# Patient Record
Sex: Male | Born: 1945 | Race: Black or African American | Hispanic: No | Smoking: Former smoker
Health system: Southern US, Community
[De-identification: ages and names within clinical notes are randomized; demographics above are authoritative.]

## PROBLEM LIST (undated history)

## (undated) ENCOUNTER — Emergency Department (HOSPITAL_COMMUNITY): Payer: Medicare PPO

## (undated) DIAGNOSIS — Z8619 Personal history of other infectious and parasitic diseases: Secondary | ICD-10-CM

## (undated) HISTORY — DX: Personal history of other infectious and parasitic diseases: Z86.19

---

## 2007-04-21 LAB — HM COLONOSCOPY

## 2008-01-29 ENCOUNTER — Emergency Department (HOSPITAL_COMMUNITY): Admission: EM | Admit: 2008-01-29 | Discharge: 2008-01-29 | Payer: Self-pay | Admitting: Emergency Medicine

## 2010-11-29 LAB — POCT CARDIAC MARKERS
Myoglobin, poc: 178 ng/mL (ref 12–200)
Troponin i, poc: <0.05 ng/mL (ref 0.00–0.09)

## 2010-11-29 LAB — COMPREHENSIVE METABOLIC PANEL
AST: 20 U/L (ref 0–37)
Albumin: 3.5 g/dL (ref 3.5–5.2)
Alkaline Phosphatase: 58 U/L (ref 39–117)
Chloride: 104 mEq/L (ref 96–112)
GFR calc Af Amer: >60 mL/min (ref >60)
Potassium: 4.1 mEq/L (ref 3.5–5.1)
Sodium: 137 mEq/L (ref 135–145)
Total Bilirubin: 1.8 mg/dL — ABNORMAL HIGH (ref 0.3–1.2)
Total Protein: 7.1 g/dL (ref 6.0–8.3)

## 2010-11-29 LAB — DIFFERENTIAL
Basophils Absolute: 0 10*3/uL (ref 0.0–0.1)
Basophils Relative: 0 % (ref 0–1)
Eosinophils Relative: 0 % (ref 0–5)
Lymphocytes Relative: 9 % — ABNORMAL LOW (ref 12–46)
Monocytes Absolute: 1.7 10*3/uL — ABNORMAL HIGH (ref 0.1–1.0)
Monocytes Relative: 10 % (ref 3–12)

## 2010-11-29 LAB — CBC
Platelets: 176 10*3/uL (ref 150–400)
WBC: 16.5 10*3/uL — ABNORMAL HIGH (ref 4.0–10.5)

## 2013-10-26 ENCOUNTER — Emergency Department (HOSPITAL_COMMUNITY)
Admission: EM | Admit: 2013-10-26 | Discharge: 2013-10-26 | Disposition: A | Discharge status: Home / Self Care | Payer: Medicare Other | Attending: Emergency Medicine | Admitting: Emergency Medicine

## 2013-10-26 DIAGNOSIS — T63001A Toxic effect of unspecified snake venom, accidental (unintentional), initial encounter: Secondary | ICD-10-CM | POA: Diagnosis not present

## 2013-10-26 DIAGNOSIS — S61209A Unspecified open wound of unspecified finger without damage to nail, initial encounter: Secondary | ICD-10-CM | POA: Diagnosis not present

## 2013-10-26 DIAGNOSIS — T6391XA Toxic effect of contact with unspecified venomous animal, accidental (unintentional), initial encounter: Secondary | ICD-10-CM | POA: Diagnosis not present

## 2013-10-26 DIAGNOSIS — Z79899 Other long term (current) drug therapy: Secondary | ICD-10-CM | POA: Insufficient documentation

## 2013-10-26 DIAGNOSIS — Y93H2 Activity, gardening and landscaping: Secondary | ICD-10-CM | POA: Insufficient documentation

## 2013-10-26 DIAGNOSIS — T148XXA Other injury of unspecified body region, initial encounter: Secondary | ICD-10-CM | POA: Diagnosis not present

## 2013-10-26 DIAGNOSIS — T63121A Toxic effect of venom of other venomous lizard, accidental (unintentional), initial encounter: Secondary | ICD-10-CM | POA: Insufficient documentation

## 2013-10-26 DIAGNOSIS — T1490XA Injury, unspecified, initial encounter: Secondary | ICD-10-CM | POA: Diagnosis not present

## 2013-10-26 DIAGNOSIS — Y92009 Unspecified place in unspecified non-institutional (private) residence as the place of occurrence of the external cause: Secondary | ICD-10-CM | POA: Insufficient documentation

## 2013-10-26 LAB — CBC
HEMATOCRIT: 39.6 % (ref 39.0–52.0)
HEMOGLOBIN: 13.9 g/dL (ref 13.0–17.0)
MCH: 33.3 pg (ref 26.0–34.0)
MCHC: 35.1 g/dL (ref 30.0–36.0)
MCV: 95 fL (ref 78.0–100.0)
Platelets: 207 10*3/uL (ref 150–400)
RBC: 4.17 MIL/uL — ABNORMAL LOW (ref 4.22–5.81)
RDW: 11.7 % (ref 11.5–15.5)
WBC: 7.2 10*3/uL (ref 4.0–10.5)

## 2013-10-26 LAB — BASIC METABOLIC PANEL
Anion gap: 15 (ref 5–15)
BUN: 15 mg/dL (ref 6–23)
CO2: 22 meq/L (ref 19–32)
Calcium: 9.8 mg/dL (ref 8.4–10.5)
Chloride: 102 mEq/L (ref 96–112)
Creatinine, Ser: 1.37 mg/dL — ABNORMAL HIGH (ref 0.50–1.35)
GFR calc Af Amer: 59 mL/min — ABNORMAL LOW (ref >90)
GFR calc non Af Amer: 51 mL/min — ABNORMAL LOW (ref >90)
GLUCOSE: 115 mg/dL — AB (ref 70–99)
POTASSIUM: 4.4 meq/L (ref 3.7–5.3)
SODIUM: 139 meq/L (ref 137–147)

## 2013-10-26 LAB — PROTIME-INR
INR: 0.96 (ref 0.00–1.49)
PROTHROMBIN TIME: 12.8 s (ref 11.6–15.2)

## 2013-10-26 LAB — APTT: aPTT: 26 seconds (ref 24–37)

## 2013-10-26 LAB — FIBRINOGEN: Fibrinogen: 340 mg/dL (ref 204–475)

## 2013-10-26 MED ORDER — HYDROMORPHONE HCL PF 1 MG/ML IJ SOLN
1.0000 mg | Freq: Once | INTRAMUSCULAR | Status: AC
  Administered 2013-10-26: 1 mg via INTRAVENOUS
  Filled 2013-10-26: qty 1

## 2013-10-26 NOTE — ED Notes (Signed)
Bed: NF62 Expected date:  Expected time:  Means of arrival:  Comments: [place]

## 2013-10-26 NOTE — ED Notes (Signed)
Spoke to [employer] Judeth Cornfield) who advised the following: Lab work CBC, PT, INR, Fibrinogen, and BMET 4-6 hours from now.  Elevation of extremity ideally 18 inches above heart. Evaluating extremity edema , pulse and capillary refill. Judeth Cornfield will fax the management protocol needed

## 2013-10-26 NOTE — ED Provider Notes (Signed)
CSN: [id number]     Arrival date & time 10/26/13  1042 History   First MD Initiated Contact with Patient 10/26/13 1043     Chief Complaint  Patient presents with  . Snake Bite     HPI Patient reports copper head bite to his right ring finger which occurred just prior to arrival.  He has pain and swelling into his right hand.  He was cleaning around the bushes in his front yard.pain is mild in severity only at this time.  No other complaints.  Denies nausea vomiting.  No shortness of breath.  No headache.  No diaphoresis   No past medical history on file. No past surgical history on file. No family history on file. History  Substance Use Topics  . Smoking status: Not on file  . Smokeless tobacco: Not on file  . Alcohol Use: Not on file    Review of Systems  All other systems reviewed and are negative.     Allergies  Review of patient's allergies indicates no known allergies.  Home Medications   Prior to Admission medications   Medication Sig Start Date End Date Taking? Authorizing Provider  aspirin EC 81 MG tablet Take 81 mg by mouth once as needed.   Yes Historical Provider, MD  Multiple Vitamin (MULTIVITAMIN WITH MINERALS) TABS tablet Take 1 tablet by mouth daily.   Yes Historical Provider, MD   BP 131/85  Resp 21 Physical Exam  Nursing note and vitals reviewed. Constitutional: He is oriented to person, place, and time. He appears well-developed and well-nourished.  HENT:  Head: Normocephalic.  Eyes: EOM are normal.  Neck: Normal range of motion.  Pulmonary/Chest: Effort normal.  Abdominal: He exhibits no distension.  Musculoskeletal: Normal range of motion.  Focus swelling of his right hand.  This does not cross the margin of the right wrist.  Small single envenomation site noted on the middle phalanx of the right ring finger.  Neurological: He is alert and oriented to person, place, and time.  Psychiatric: He has a normal mood and affect.    ED Course   Procedures (including critical care time) Labs Review Labs Reviewed  CBC - Abnormal; Notable for the following:    RBC 4.17 (*)    All other components within normal limits  BASIC METABOLIC PANEL - Abnormal; Notable for the following:    Glucose, Bld 115 (*)    Creatinine, Ser 1.37 (*)    GFR calc non Af Amer 51 (*)    GFR calc Af Amer 59 (*)    All other components within normal limits  PROTIME-INR  APTT  FIBRINOGEN    Imaging Review No results found.   EKG Interpretation None      MDM   Final diagnoses:  Snake bite, accidental or unintentional, initial encounter    Labs ordered.  Patient be observed in the emergency department for several hours.  We'll continue to treat symptoms and monitor for swelling.  Currently following the [facility] snakebite protocol. Case was discussed with [facility] as well  3:59 PM Patient was observed in the emergency department for approximately 4 and half hours.  He has had resolution of his swelling.  It has not progressed.  Discharge home in good condition    Lyanne Co, MD 10/26/13 1600

## 2013-10-26 NOTE — ED Notes (Signed)
He remains comfortable and cheerful as his right arm/hand remain elevated in an ingenious soft sling applied by our ortho. Tech.  The erythema of right hand and wrist are somewhat diminished as compared to when he first arrived.  Edema of medial posterior right hand is essentially the same as when he arrived.  He denies h/a, nor any breathing difficulties/issues and is in no distress.  He is pleasantly visiting with his wife.

## 2013-11-02 DIAGNOSIS — T6391XA Toxic effect of contact with unspecified venomous animal, accidental (unintentional), initial encounter: Secondary | ICD-10-CM | POA: Diagnosis not present

## 2013-11-02 DIAGNOSIS — I959 Hypotension, unspecified: Secondary | ICD-10-CM | POA: Diagnosis not present

## 2013-11-02 DIAGNOSIS — M6740 Ganglion, unspecified site: Secondary | ICD-10-CM | POA: Diagnosis not present

## 2013-11-02 DIAGNOSIS — Z1331 Encounter for screening for depression: Secondary | ICD-10-CM | POA: Diagnosis not present

## 2014-06-19 ENCOUNTER — Telehealth: Payer: Self-pay | Admitting: Family Medicine

## 2014-06-19 NOTE — Telephone Encounter (Signed)
OKay to schedule earlier in next week.

## 2014-06-19 NOTE — Telephone Encounter (Signed)
Raiford SimmondsHarrett Eldridge [birth year] called wanting to make a new patient appointment with you for Conal Cavanagh.  Your next new patient appointment is in nov  Ms eldridge stated you told her he could be seen sooner.  He has a boil on his back.  Is it ok to schedule??

## 2014-06-21 NOTE — Telephone Encounter (Signed)
Appointment 5/5 pt aware °

## 2014-06-29 ENCOUNTER — Encounter (INDEPENDENT_AMBULATORY_CARE_PROVIDER_SITE_OTHER): Payer: Self-pay

## 2014-06-29 ENCOUNTER — Ambulatory Visit (INDEPENDENT_AMBULATORY_CARE_PROVIDER_SITE_OTHER): Payer: Medicare Other | Admitting: Family Medicine

## 2014-06-29 ENCOUNTER — Encounter: Payer: Self-pay | Admitting: Family Medicine

## 2014-06-29 VITALS — BP 110/60 | HR 84 | Temp 97.6°F | Ht 71.0 in | Wt 234.5 lb

## 2014-06-29 DIAGNOSIS — L02212 Cutaneous abscess of back [any part, except buttock]: Secondary | ICD-10-CM | POA: Diagnosis not present

## 2014-06-29 MED ORDER — CEPHALEXIN 500 MG PO CAPS: 500.0000 mg | ORAL_CAPSULE | Freq: Three times a day (TID) | ORAL | Status: DC

## 2014-06-29 MED ORDER — SILDENAFIL CITRATE 50 MG PO TABS: 50.0000 mg | ORAL_TABLET | Freq: Every day | ORAL | Status: DC | PRN

## 2014-06-29 NOTE — Progress Notes (Signed)
Pre visit review using our clinic review tool, if applicable. No additional management support is needed unless otherwise documented below in the visit note. 

## 2014-06-29 NOTE — Progress Notes (Signed)
Subjective:    Patient ID: Christopher [name], male    DOB: 1945-03-08, 69 y.o.   MRN: [id number]  HPI 69 year old male presents to establish. He reports no other health problems. Followed by Dr. Azucena Kubaeid at [facility].  Last CPX: summer 2015  Has not had labs in over a year. .  Has cyst on back,  Reappeared in last month.  Has been applying salve.  Had removed last year after infection by Dr. Azucena Kubaeid.  Lesion resolves then comes back. Notes pus draining out. No fever.  Some surrounding erythema and pain.     Review of Systems  Constitutional: Negative for fever, fatigue and unexpected weight change.  HENT: Negative for congestion, ear pain, postnasal drip, rhinorrhea, sore throat and trouble swallowing.   Eyes: Negative for pain.  Respiratory: Negative for cough, shortness of breath and wheezing.   Cardiovascular: Negative for chest pain, palpitations and leg swelling.  Gastrointestinal: Negative for nausea, abdominal pain, diarrhea, constipation and blood in stool.  Genitourinary: Negative for dysuria, urgency, hematuria, discharge, penile swelling, scrotal swelling, difficulty urinating, penile pain and testicular pain.  Skin: Negative for rash.  Neurological: Negative for syncope, weakness, light-headedness, numbness and headaches.  Psychiatric/Behavioral: Negative for behavioral problems and dysphoric mood. The patient is not nervous/anxious.        Objective:   Physical Exam  Constitutional: He appears well-developed and well-nourished.  Non-toxic appearance. He does not appear ill. No distress.  HENT:  Head: Normocephalic and atraumatic.  Right Ear: Hearing, tympanic membrane, external ear and ear canal normal.  Left Ear: Hearing, tympanic membrane, external ear and ear canal normal.  Nose: Nose normal.  Mouth/Throat: Uvula is midline, oropharynx is clear and moist and mucous membranes are normal.  Eyes: Conjunctivae, EOM and lids are normal. Pupils are equal, round, and  reactive to light. Lids are everted and swept, no foreign bodies found.  Neck: Trachea normal, normal range of motion and phonation normal. Neck supple. Carotid bruit is not present. No thyroid mass and no thyromegaly present.  Cardiovascular: Normal rate, regular rhythm, S1 normal, S2 normal, intact distal pulses and normal pulses.  Exam reveals no gallop.   No murmur heard. Pulmonary/Chest: Breath sounds normal. He has no wheezes. He has no rhonchi. He has no rales.  Abdominal: Soft. Normal appearance and bowel sounds are normal. There is no hepatosplenomegaly. There is no tenderness. There is no rebound, no guarding and no CVA tenderness. No hernia. Hernia confirmed negative in the right inguinal area and confirmed negative in the left inguinal area.  Genitourinary: Prostate normal, testes normal and penis normal. Rectal exam shows no external hemorrhoid, no internal hemorrhoid, no fissure, no mass, no tenderness and anal tone normal. Guaiac negative stool. Prostate is not enlarged and not tender. Right testis shows no mass and no tenderness. Left testis shows no mass and no tenderness. No paraphimosis or penile tenderness.  Lymphadenopathy:    He has no cervical adenopathy.       Right: No inguinal adenopathy present.       Left: No inguinal adenopathy present.  Neurological: He is alert. He has normal strength and normal reflexes. No cranial nerve deficit or sensory deficit. Gait normal.  Skin: Skin is warm, dry and intact. Lesion noted. No rash noted.  Raised  1.5 cm lesion, slight redness, no surrounding erythema, central pore present Mildly tender  Psychiatric: He has a normal mood and affect. His speech is normal and behavior is normal. Judgment normal.  Assessment & Plan:

## 2014-06-29 NOTE — Patient Instructions (Addendum)
Schedule CPX with labs prior  in next 3 months.  warm compresses, compelte antibitoics.  Cal if drainage and redness not improving, call sooner if fever or redness spreading.

## 2014-07-27 DIAGNOSIS — L02212 Cutaneous abscess of back [any part, except buttock]: Secondary | ICD-10-CM | POA: Insufficient documentation

## 2014-07-27 NOTE — Assessment & Plan Note (Signed)
Given drainage. Treat with warm compresses, complete antibiotics . Follow up for I and D if not resolving as expected.

## 2014-09-28 ENCOUNTER — Telehealth: Payer: Self-pay | Admitting: Family Medicine

## 2014-09-28 ENCOUNTER — Other Ambulatory Visit (INDEPENDENT_AMBULATORY_CARE_PROVIDER_SITE_OTHER): Payer: Medicare Other

## 2014-09-28 DIAGNOSIS — Z13220 Encounter for screening for lipoid disorders: Secondary | ICD-10-CM

## 2014-09-28 LAB — COMPREHENSIVE METABOLIC PANEL
ALBUMIN: 4.2 g/dL (ref 3.5–5.2)
ALT: 19 U/L (ref 0–53)
AST: 20 U/L (ref 0–37)
Alkaline Phosphatase: 59 U/L (ref 39–117)
BUN: 14 mg/dL (ref 6–23)
CHLORIDE: 105 meq/L (ref 96–112)
CO2: 29 mEq/L (ref 19–32)
Calcium: 9.3 mg/dL (ref 8.4–10.5)
Creatinine, Ser: 1.45 mg/dL (ref 0.40–1.50)
GFR: 61.89 mL/min (ref >60.00)
GLUCOSE: 101 mg/dL — AB (ref 70–99)
Potassium: 4.4 mEq/L (ref 3.5–5.1)
Sodium: 139 mEq/L (ref 135–145)
Total Bilirubin: 0.9 mg/dL (ref 0.2–1.2)
Total Protein: 7.3 g/dL (ref 6.0–8.3)

## 2014-09-28 LAB — LIPID PANEL
CHOL/HDL RATIO: 6
Cholesterol: 216 mg/dL — ABNORMAL HIGH (ref 0–200)
HDL: 37.8 mg/dL — AB (ref >39.00)
LDL Cholesterol: 152 mg/dL — ABNORMAL HIGH (ref 0–99)
NonHDL: 177.7
Triglycerides: 131 mg/dL (ref 0.0–149.0)
VLDL: 26.2 mg/dL (ref 0.0–40.0)

## 2014-09-28 NOTE — Telephone Encounter (Signed)
-----   Message from Alvina Chou sent at 09/21/2014  3:36 PM EDT ----- Regarding: Lab orders for Thursday, 8.4.16 Patient is scheduled for CPX labs, please order future labs, Thanks , Camelia Eng

## 2014-10-05 ENCOUNTER — Ambulatory Visit (INDEPENDENT_AMBULATORY_CARE_PROVIDER_SITE_OTHER): Payer: Medicare Other | Admitting: Family Medicine

## 2014-10-05 ENCOUNTER — Encounter: Payer: Self-pay | Admitting: Family Medicine

## 2014-10-05 VITALS — BP 110/70 | HR 66 | Temp 97.7°F | Ht 71.0 in | Wt 236.0 lb

## 2014-10-05 DIAGNOSIS — Z7189 Other specified counseling: Secondary | ICD-10-CM

## 2014-10-05 DIAGNOSIS — Z23 Encounter for immunization: Secondary | ICD-10-CM

## 2014-10-05 DIAGNOSIS — E786 Lipoprotein deficiency: Secondary | ICD-10-CM

## 2014-10-05 DIAGNOSIS — Z0000 Encounter for general adult medical examination without abnormal findings: Secondary | ICD-10-CM

## 2014-10-05 DIAGNOSIS — N182 Chronic kidney disease, stage 2 (mild): Secondary | ICD-10-CM | POA: Diagnosis not present

## 2014-10-05 DIAGNOSIS — E7800 Pure hypercholesterolemia, unspecified: Secondary | ICD-10-CM

## 2014-10-05 NOTE — Addendum Note (Signed)
Addended by: Desmond Dike on: 10/05/2014 12:31 PM   Modules accepted: Orders

## 2014-10-05 NOTE — Progress Notes (Signed)
Pre visit review using our clinic review tool, if applicable. No additional management support is needed unless otherwise documented below in the visit note. 

## 2014-10-05 NOTE — Assessment & Plan Note (Signed)
Push fluids, avoid NSAIDs etc. BP well controlled, no DM.

## 2014-10-05 NOTE — Assessment & Plan Note (Signed)
INfo given on diet and exercise.  Reviewed in detail. Recheck in 1 year.

## 2014-10-05 NOTE — Patient Instructions (Addendum)
Work on low cholesterol diet. Decrease cream, butter and baked goods. Increase exercise to 3-5 times a week.  Drink lots of water and avoid ibuprofen, aleve to keep kidney's healthy.

## 2014-10-05 NOTE — Progress Notes (Signed)
Subjective:    Patient ID: Christopher [name], male    DOB: Jan 13, 1946, 69 y.o.   MRN: [id number]  HPI I have personally reviewed the Medicare Annual Wellness questionnaire and have noted 1. The patient's medical and social history 2. Their use of alcohol, tobacco or illicit drugs 3. Their current medications and supplements 4. The patient's functional ability including ADL's, fall risks, home safety risks and hearing or visual             impairment. 5. Diet and physical activities 6. Evidence for depression or mood disorders 7.         Updated provider list The patients weight, height, BMI and visual acuity have been recorded in the chart I have made referrals, counseling and provided education to the patient based review of the above and I have provided the pt with a written personalized care plan for preventive services.    BP Readings from Last 3 Encounters:  10/05/14 110/70  06/29/14 110/60  10/26/13 112/79    High cholesterol, new diagnosis. Goal LDl < 130. On no med.  Lab Results  Component Value Date   CHOL 216* 09/28/2014   HDL 37.80* 09/28/2014   LDLCALC 152* 09/28/2014   TRIG 131.0 09/28/2014   CHOLHDL 6 09/28/2014  Using medications without problems: Muscle aches:  Diet compliance: Moderate Exercise: twice daily walking Other complaints:       Review of Systems  Constitutional: Negative for fever and fatigue.  HENT: Negative for ear pain.   Eyes: Negative for pain.  Respiratory: Negative for cough and shortness of breath.   Cardiovascular: Negative for chest pain, palpitations and leg swelling.  Gastrointestinal: Negative for abdominal pain.  Genitourinary: Negative for dysuria.  Musculoskeletal: Negative for arthralgias.  Neurological: Negative for syncope, light-headedness and headaches.  Psychiatric/Behavioral: Negative for dysphoric mood.       Objective:   Physical Exam  Constitutional: He appears well-developed and well-nourished.  Non-toxic  appearance. He does not appear ill. No distress.  HENT:  Head: Normocephalic and atraumatic.  Right Ear: Hearing, tympanic membrane, external ear and ear canal normal.  Left Ear: Hearing, tympanic membrane, external ear and ear canal normal.  Nose: Nose normal.  Mouth/Throat: Uvula is midline, oropharynx is clear and moist and mucous membranes are normal.  Eyes: Conjunctivae, EOM and lids are normal. Pupils are equal, round, and reactive to light. Lids are everted and swept, no foreign bodies found.  Neck: Trachea normal, normal range of motion and phonation normal. Neck supple. Carotid bruit is not present. No thyroid mass and no thyromegaly present.  Cardiovascular: Normal rate, regular rhythm, S1 normal, S2 normal, intact distal pulses and normal pulses.  Exam reveals no gallop.   No murmur heard. Pulmonary/Chest: Breath sounds normal. He has no wheezes. He has no rhonchi. He has no rales.  Abdominal: Soft. Normal appearance and bowel sounds are normal. There is no hepatosplenomegaly. There is no tenderness. There is no rebound, no guarding and no CVA tenderness. No hernia.  Genitourinary: Prostate normal.  Lymphadenopathy:    He has no cervical adenopathy.  Neurological: He is alert. He has normal strength and normal reflexes. No cranial nerve deficit or sensory deficit. Gait normal.  Skin: Skin is warm, dry and intact. No rash noted.  Psychiatric: He has a normal mood and affect. His speech is normal and behavior is normal. Judgment normal.          Assessment & Plan:  The patient's preventative maintenance and recommended screening  tests for an annual wellness exam were reviewed in full today. Brought up to date unless services declined.  Counselled on the importance of diet, exercise, and its role in overall health and mortality. The patient's FH and SH was reviewed, including their home life, tobacco status, and drug and alcohol status.   Vaccines: uptodate tdap. Given prevnar  today.  prostate: No results found for: PSA no prostate cancer in family. Plan stopping PSA, prostate exam given age. Hep C: will do at next visit. Colon: 2008 polyps, repeat in 10 years. Dr. Bosie Clos Former smoker:  remotely

## 2015-08-02 LAB — FECAL OCCULT BLOOD, GUAIAC: Fecal Occult Blood: NEGATIVE

## 2015-08-21 ENCOUNTER — Encounter: Payer: Self-pay | Admitting: Family Medicine

## 2015-10-09 DIAGNOSIS — H2513 Age-related nuclear cataract, bilateral: Secondary | ICD-10-CM | POA: Diagnosis not present

## 2015-10-09 DIAGNOSIS — H524 Presbyopia: Secondary | ICD-10-CM | POA: Diagnosis not present

## 2015-11-08 ENCOUNTER — Telehealth: Payer: Self-pay | Admitting: Family Medicine

## 2015-11-08 ENCOUNTER — Other Ambulatory Visit (INDEPENDENT_AMBULATORY_CARE_PROVIDER_SITE_OTHER): Payer: Medicare PPO

## 2015-11-08 ENCOUNTER — Ambulatory Visit (INDEPENDENT_AMBULATORY_CARE_PROVIDER_SITE_OTHER): Payer: Medicare PPO

## 2015-11-08 VITALS — BP 112/80 | HR 75 | Temp 97.4°F | Ht 71.25 in | Wt 237.2 lb

## 2015-11-08 DIAGNOSIS — E7800 Pure hypercholesterolemia, unspecified: Secondary | ICD-10-CM

## 2015-11-08 DIAGNOSIS — Z23 Encounter for immunization: Secondary | ICD-10-CM | POA: Diagnosis not present

## 2015-11-08 DIAGNOSIS — Z0000 Encounter for general adult medical examination without abnormal findings: Secondary | ICD-10-CM

## 2015-11-08 DIAGNOSIS — Z1159 Encounter for screening for other viral diseases: Secondary | ICD-10-CM

## 2015-11-08 LAB — COMPREHENSIVE METABOLIC PANEL
ALT: 17 U/L (ref 0–53)
AST: 17 U/L (ref 0–37)
Albumin: 4.3 g/dL (ref 3.5–5.2)
Alkaline Phosphatase: 58 U/L (ref 39–117)
BILIRUBIN TOTAL: 0.7 mg/dL (ref 0.2–1.2)
BUN: 15 mg/dL (ref 6–23)
CO2: 31 meq/L (ref 19–32)
CREATININE: 1.29 mg/dL (ref 0.40–1.50)
Calcium: 9.2 mg/dL (ref 8.4–10.5)
Chloride: 105 mEq/L (ref 96–112)
GFR: 70.6 mL/min (ref >60.00)
GLUCOSE: 99 mg/dL (ref 70–99)
Potassium: 4.3 mEq/L (ref 3.5–5.1)
Sodium: 139 mEq/L (ref 135–145)
Total Protein: 7.3 g/dL (ref 6.0–8.3)

## 2015-11-08 LAB — LIPID PANEL
CHOL/HDL RATIO: 6
Cholesterol: 200 mg/dL (ref 0–200)
HDL: 35.3 mg/dL — AB (ref >39.00)
LDL Cholesterol: 141 mg/dL — ABNORMAL HIGH (ref 0–99)
NONHDL: 164.69
Triglycerides: 119 mg/dL (ref 0.0–149.0)
VLDL: 23.8 mg/dL (ref 0.0–40.0)

## 2015-11-08 NOTE — Telephone Encounter (Signed)
-----   Message from Natasha C Chavers sent at 11/02/2015  1:47 PM EDT ----- Regarding: Cpx labs Thurs 9/14, need orders. Thanks :-) Please order  future cpx labs for pt's upcoming lab appt. Thanks Tasha  

## 2015-11-08 NOTE — Progress Notes (Signed)
PCP notes:   Health maintenance:  Flu vaccine - pt will discuss with PCP at CPE Colon cancer screening - pt does FOBT through insurance Hep C screening - completed PPSV23 - administered   Abnormal screenings:   Hearing - failed  Patient concerns:   None  Nurse concerns:  None  Next PCP appt:   11/15/15 @ 1115

## 2015-11-08 NOTE — Progress Notes (Signed)
Subjective:   Christopher [name] is a 70 y.o. male who presents for Medicare Annual/Subsequent preventive examination.  Review of Systems:  N/A Cardiac Risk Factors include: advanced age (>46men, >54 women);male gender;dyslipidemia     Objective:    Vitals: BP 112/80 (BP Location: Left Arm, Patient Position: Sitting, Cuff Size: Normal)   Pulse 75   Temp 97.4 F (36.3 C) (Oral)   Ht 5' 11.25" (1.81 m) Comment: no shoes  Wt 237 lb 4 oz (107.6 kg)   SpO2 94%   BMI 32.86 kg/m   Body mass index is 32.86 kg/m.  Tobacco History  Smoking Status  . Former Smoker  . Packs/day: 1.00  . Years: 16.00  . Types: Cigarettes  . Quit date: 06/29/1982  Smokeless Tobacco  . Never Used     Counseling given: No   Past Medical History:  Diagnosis Date  . History of chicken pox    History reviewed. No pertinent surgical history. Family History  Problem Relation Age of Onset  . Diabetes Mother   . Heart disease Father    History  Sexual Activity  . Sexual activity: No    Outpatient Encounter Prescriptions as of 11/08/2015  Medication Sig  . aspirin EC 81 MG tablet Take 81 mg by mouth once as needed.  . Doxylamine Succinate, Sleep, (SLEEP AID PO) Take 1 tablet by mouth at bedtime as needed.  . Multiple Vitamin (MULTIVITAMIN WITH MINERALS) TABS tablet Take 1 tablet by mouth daily.   No facility-administered encounter medications on file as of 11/08/2015.     Activities of Daily Living In your present state of health, do you have any difficulty performing the following activities: 11/08/2015  Hearing? N  Vision? N  Difficulty concentrating or making decisions? N  Walking or climbing stairs? N  Dressing or bathing? N  Doing errands, shopping? N  Preparing Food and eating ? N  Using the Toilet? N  In the past six months, have you accidently leaked urine? N  Do you have problems with loss of bowel control? N  Managing your Medications? N  Managing your Finances? N  Housekeeping  or managing your Housekeeping? N  Some recent data might be hidden    Patient Care Team: Excell Seltzer, MD as PCP - General (Family Medicine)   Assessment:     Hearing Screening   125Hz  250Hz  500Hz  1000Hz  2000Hz  3000Hz  4000Hz  6000Hz  8000Hz   Right ear:   0 0 40  40    Left ear:   40 40 40  40    Vision Screening Comments: Last eye exam with Dr. Druscilla Brownie B on 10/07/15   Exercise Activities and Dietary recommendations Current Exercise Habits: Home exercise routine, Type of exercise: walking, Time (Minutes): 60, Frequency (Times/Week): 4, Weekly Exercise (Minutes/Week): 240, Intensity: Mild, Exercise limited by: None identified  Goals    . Increase physical activity          Starting 11/08/2015, I will continue to walk at least 60 min 4 days per week.       Fall Risk Fall Risk  11/08/2015  Falls in the past year? No   Depression Screen PHQ 2/9 Scores 11/08/2015  PHQ - 2 Score 0    Cognitive Testing MMSE - Mini Mental State Exam 11/08/2015  Orientation to time 5  Orientation to Place 5  Registration 3  Attention/ Calculation 0  Recall 3  Language- name 2 objects 0  Language- repeat 1  Language- follow 3 step command  3  Language- read & follow direction 0  Write a sentence 0  Copy design 0  Total score 20   PLEASE NOTE: A Mini-Cog screen was completed. Maximum score is 20. A value of 0 denotes this part of Folstein MMSE was not completed or the patient failed this part of the Mini-Cog screening.   Mini-Cog Screening Orientation to Time - Max 5 pts Orientation to Place - Max 5 pts Registration - Max 3 pts Recall - Max 3 pts Language Repeat - Max 1 pts Language Follow 3 Step Command - Max 3 pts   Immunization History  Administered Date(s) Administered  . Pneumococcal Conjugate-13 10/05/2014  . Pneumococcal Polysaccharide-23 11/08/2015  . Tdap 07/29/2013  . Varicella 02/25/2012   Screening Tests Health Maintenance  Topic Date Due  . INFLUENZA VACCINE  11/15/2015  (Originally 09/25/2015)  . COLONOSCOPY  11/07/2025 (Originally 10/22/1994)  . COLON CANCER SCREENING ANNUAL FOBT  08/01/2016  . TETANUS/TDAP  07/30/2023  . ZOSTAVAX  Addressed  . Hepatitis C Screening  Completed  . PNA vac Low Risk Adult  Completed      Plan:     I have personally reviewed and addressed the Medicare Annual Wellness questionnaire and have noted the following in the patient's chart:  A. Medical and social history B. Use of alcohol, tobacco or illicit drugs  C. Current medications and supplements D. Functional ability and status E.  Nutritional status F.  Physical activity G. Advance directives H. List of other physicians I.  Hospitalizations, surgeries, and ER visits in previous 12 months J.  Vitals K. Screenings to include hearing, vision, cognitive, depression L. Referrals and appointments - none  In addition, I have reviewed and discussed with patient certain preventive protocols, quality metrics, and best practice recommendations. A written personalized care plan for preventive services as well as general preventive health recommendations were provided to patient.  See attached scanned questionnaire for additional information.   Signed,   Randa EvensLesia Rhyder Koegel, MHA, BS, LPN Health Advisor

## 2015-11-08 NOTE — Progress Notes (Signed)
I reviewed health advisor's note, was available for consultation, and agree with documentation and plan.   Signed,  Icy Fuhrmann T. Marika Mahaffy, MD  

## 2015-11-08 NOTE — Progress Notes (Signed)
Pre visit review using our clinic review tool, if applicable. No additional management support is needed unless otherwise documented below in the visit note. 

## 2015-11-08 NOTE — Patient Instructions (Signed)
[name] , Thank you for taking time to come for your Medicare Wellness Visit. I appreciate your ongoing commitment to your health goals. Please review the following plan we discussed and let me know if I can assist you in the future.   These are the goals we discussed: Goals    . Increase physical activity          Starting 11/08/2015, I will continue to walk at least 60 min 4 days per week.        This is a list of the screening recommended for you and due dates:  Health Maintenance  Topic Date Due  . Flu Shot  11/15/2015*  . Colon Cancer Screening  11/07/2025*  . Stool Blood Test  08/01/2016  . Tetanus Vaccine  07/30/2023  . Shingles Vaccine  Addressed  .  Hepatitis C: One time screening is recommended by Center for Disease Control  (CDC) for  adults born from [birth year] through 1965.   Completed  . Pneumonia vaccines  Completed  *Topic was postponed. The date shown is not the original due date.   Preventive Care for Adults  A healthy lifestyle and preventive care can promote health and wellness. Preventive health guidelines for adults include the following key practices.  . A routine yearly physical is a good way to check with your health care provider about your health and preventive screening. It is a chance to share any concerns and updates on your health and to receive a thorough exam.  . Visit your dentist for a routine exam and preventive care every 6 months. Brush your teeth twice a day and floss once a day. Good oral hygiene prevents tooth decay and gum disease.  . The frequency of eye exams is based on your age, health, family medical history, use  of contact lenses, and other factors. Follow your health care provider's ecommendations for frequency of eye exams.  . Eat a healthy diet. Foods like vegetables, fruits, whole grains, low-fat dairy products, and lean protein foods contain the nutrients you need without too many calories. Decrease your intake of foods high in  solid fats, added sugars, and salt. Eat the right amount of calories for you. Get information about a proper diet from your health care provider, if necessary.  . Regular physical exercise is one of the most important things you can do for your health. Most adults should get at least 150 minutes of moderate-intensity exercise (any activity that increases your heart rate and causes you to sweat) each week. In addition, most adults need muscle-strengthening exercises on 2 or more days a week.  Silver Sneakers may be a benefit available to you. To determine eligibility, you may visit the website: www.silversneakers.com or contact program at [phone] Mon-Fri between 8AM-8PM.   . Maintain a healthy weight. The body mass index (BMI) is a screening tool to identify possible weight problems. It provides an estimate of body fat based on height and weight. Your health care provider can find your BMI and can help you achieve or maintain a healthy weight.   For adults 20 years and older: ? A BMI below 18.5 is considered underweight. ? A BMI of 18.5 to 24.9 is normal. ? A BMI of 25 to 29.9 is considered overweight. ? A BMI of 30 and above is considered obese.   . Maintain normal blood lipids and cholesterol levels by exercising and minimizing your intake of saturated fat. Eat a balanced diet with plenty of fruit and  vegetables. Blood tests for lipids and cholesterol should begin at age 34 and be repeated every 5 years. If your lipid or cholesterol levels are high, you are over 50, or you are at high risk for heart disease, you may need your cholesterol levels checked more frequently. Ongoing high lipid and cholesterol levels should be treated with medicines if diet and exercise are not working.  . If you smoke, find out from your health care provider how to quit. If you do not use tobacco, please do not start.  . If you choose to drink alcohol, please do not consume more than 2 drinks per day. One drink  is considered to be 12 ounces (355 mL) of beer, 5 ounces (148 mL) of wine, or 1.5 ounces (44 mL) of liquor.  . If you are 55-46 years old, ask your health care provider if you should take aspirin to prevent strokes.  . Use sunscreen. Apply sunscreen liberally and repeatedly throughout the day. You should seek shade when your shadow is shorter than you. Protect yourself by wearing long sleeves, pants, a wide-brimmed hat, and sunglasses year round, whenever you are outdoors.  . Once a month, do a whole body skin exam, using a mirror to look at the skin on your back. Tell your health care provider of new moles, moles that have irregular borders, moles that are larger than a pencil eraser, or moles that have changed in shape or color.

## 2015-11-09 LAB — HEPATITIS C ANTIBODY: HCV Ab: NEGATIVE

## 2015-11-15 ENCOUNTER — Ambulatory Visit (INDEPENDENT_AMBULATORY_CARE_PROVIDER_SITE_OTHER): Payer: Medicare PPO | Admitting: Family Medicine

## 2015-11-15 ENCOUNTER — Encounter: Payer: Self-pay | Admitting: Family Medicine

## 2015-11-15 DIAGNOSIS — E7800 Pure hypercholesterolemia, unspecified: Secondary | ICD-10-CM | POA: Diagnosis not present

## 2015-11-15 DIAGNOSIS — N182 Chronic kidney disease, stage 2 (mild): Secondary | ICD-10-CM | POA: Diagnosis not present

## 2015-11-15 NOTE — Progress Notes (Signed)
Pre visit review using our clinic review tool, if applicable. No additional management support is needed unless otherwise documented below in the visit note. 

## 2015-11-15 NOTE — Patient Instructions (Addendum)
Decrease red meats, cheese, baked goods with butter, fried foods.  Increase foods with veggie fats. Increase  exercise as able. Return for flu vaccine.

## 2015-11-15 NOTE — Assessment & Plan Note (Signed)
Improved from last check. 

## 2015-11-15 NOTE — Assessment & Plan Note (Signed)
LDL above goal. Info on low chol diet given. Pt will wor aggressively on diet. If not at goal, consider addition of red yeast rice.

## 2015-11-15 NOTE — Progress Notes (Signed)
Subjective:    Patient ID: Christopher [name], male    DOB: 1945/11/19, 70 y.o.   MRN: [id number]  HPI  70 year old male pt presents for AMW  Earlier he saw Christopher Duffelarlesia Pinson, LPN for medicare wellness. Note reviewed in detail when completed. Need to discuss flu vaccine.   CKD:  GFR 70, improved from last year. BP Readings from Last 3 Encounters:  11/15/15 100/64  11/08/15 112/80  10/05/14 110/70    Glucose has improved.  Elevated Cholesterol: LDL not at goal < 130 on no medication. Lab Results  Component Value Date   CHOL 200 11/08/2015   HDL 35.30 (L) 11/08/2015   LDLCALC 141 (H) 11/08/2015   TRIG 119.0 11/08/2015   CHOLHDL 6 11/08/2015  Using medications without problems: Muscle aches:  Diet compliance:moderate, eats a lot of red meats. Exercise: walking 4 times a week. Other complaints:  Wt Readings from Last 3 Encounters:  11/15/15 236 lb 8 oz (107.3 kg)  11/08/15 237 lb 4 oz (107.6 kg)  10/05/14 236 lb (107 kg)  Body mass index is 32.75 kg/m.   Social History /Family History/Past Medical History reviewed and updated if needed.   Review of Systems  Constitutional: Negative for fatigue and fever.  HENT: Negative for ear pain.   Eyes: Negative for pain.  Respiratory: Negative for cough and shortness of breath.   Cardiovascular: Negative for chest pain, palpitations and leg swelling.  Gastrointestinal: Negative for abdominal pain.  Genitourinary: Negative for dysuria.  Musculoskeletal: Negative for arthralgias.  Neurological: Negative for syncope, light-headedness and headaches.  Psychiatric/Behavioral: Negative for dysphoric mood.       Objective:   Physical Exam  Constitutional: He appears well-developed and well-nourished.  Non-toxic appearance. He does not appear ill. No distress.  HENT:  Head: Normocephalic and atraumatic.  Right Ear: Hearing, tympanic membrane, external ear and ear canal normal.  Left Ear: Hearing, tympanic membrane, external ear  and ear canal normal.  Nose: Nose normal.  Mouth/Throat: Uvula is midline, oropharynx is clear and moist and mucous membranes are normal.  Eyes: Conjunctivae, EOM and lids are normal. Pupils are equal, round, and reactive to light. Lids are everted and swept, no foreign bodies found.  Neck: Trachea normal, normal range of motion and phonation normal. Neck supple. Carotid bruit is not present. No thyroid mass and no thyromegaly present.  Cardiovascular: Normal rate, regular rhythm, S1 normal, S2 normal, intact distal pulses and normal pulses.  Exam reveals no gallop.   No murmur heard. Pulmonary/Chest: Breath sounds normal. He has no wheezes. He has no rhonchi. He has no rales.  Abdominal: Soft. Normal appearance and bowel sounds are normal. There is no hepatosplenomegaly. There is no tenderness. There is no rebound, no guarding and no CVA tenderness. No hernia.  Lymphadenopathy:    He has no cervical adenopathy.  Neurological: He is alert. He has normal strength and normal reflexes. No cranial nerve deficit or sensory deficit. Gait normal.  Skin: Skin is warm, dry and intact. No rash noted.  Psychiatric: He has a normal mood and affect. His speech is normal and behavior is normal. Judgment normal.          Assessment & Plan:  The patient's preventative maintenance and recommended screening tests for an annual wellness exam were reviewed in full today. Brought up to date unless services declined.  Counselled on the importance of diet, exercise, and its role in overall health and mortality. The patient's FH and SH was reviewed, including  their home life, tobacco status, and drug and alcohol status.   Vaccines: uptodate tdap, PNA. Discussed flu. Prostate: No prostate cancer in family. Plan stopping PSA, prostate exam given age. Hep C: Neg. Colon: 2008 polyps, repeat in 10 years. Dr. Bosie Clos Former smoker:  Remotely, asymptomatic.

## 2016-02-19 ENCOUNTER — Other Ambulatory Visit: Payer: Self-pay | Admitting: Family Medicine

## 2016-03-21 ENCOUNTER — Ambulatory Visit (INDEPENDENT_AMBULATORY_CARE_PROVIDER_SITE_OTHER): Payer: Medicare PPO | Admitting: Family Medicine

## 2016-03-21 ENCOUNTER — Encounter: Payer: Self-pay | Admitting: Family Medicine

## 2016-03-21 VITALS — BP 106/74 | HR 73 | Temp 98.0°F | Wt 238.0 lb

## 2016-03-21 DIAGNOSIS — Z23 Encounter for immunization: Secondary | ICD-10-CM

## 2016-03-21 DIAGNOSIS — M5432 Sciatica, left side: Secondary | ICD-10-CM | POA: Insufficient documentation

## 2016-03-21 MED ORDER — PREDNISONE 20 MG PO TABS: ORAL_TABLET | ORAL | 0 refills | Status: DC

## 2016-03-21 NOTE — Progress Notes (Signed)
Prev with R leg pain a few years ago, tx'd with nsaid then likely steroid taper.  Recent sx on L side, not on R.  Started about 4 days ago.  Had been working in the yard.  No falls.  No known injury.  L buttock pain, down to the L foot.  Intermittent pain, worse with walking.  No pain sitting.  No FCNAVD.  No change in urination.  Normal BMs.    Needs a flu shot.  Done at [facility] today.   Meds, vitals, and allergies reviewed.   ROS: Per HPI unless specifically indicated in ROS section   nad ncat rrr ctab abd soft, no ttp Back not ttp in midline Skin w/o rash.  SLR neg B but pain radiation down the L leg in radicular fashion with back extension.   No BLE weakness- S/S grossly wnl BLE Able to bear weight.

## 2016-03-21 NOTE — Patient Instructions (Addendum)
Use the back exercises and start prednisone with food.  Update us as needed.   Take care.  Glad to see you.

## 2016-03-21 NOTE — Assessment & Plan Note (Addendum)
D/w pt.  Would start pred taper, routine steroid cautions.  See AVS.  Update me as needed.  He agrees.  Okay for outpatient f/u.  No need for imaging.  He agrees.   Back exercises and handout d/w pt.  Relative rest in the meantime.  Flu shot today.

## 2016-03-27 ENCOUNTER — Telehealth: Payer: Self-pay

## 2016-03-27 MED ORDER — TRAMADOL HCL 50 MG PO TABS: 50.0000 mg | ORAL_TABLET | Freq: Three times a day (TID) | ORAL | 0 refills | Status: DC | PRN

## 2016-03-27 NOTE — Telephone Encounter (Signed)
Rx called to pharmacy as instructed Left message for patient to call back.

## 2016-03-27 NOTE — Telephone Encounter (Signed)
Would add on tramadol and if not better, needs to be rechecked.  Please call in tramadol.  Thanks.

## 2016-03-27 NOTE — Telephone Encounter (Signed)
VM left; pt seen 03/21/16; pt was given prednisone; prednisone is not helping the pain in lt leg. Wants to know if pain med could be sent to [facility]. Request cb.

## 2016-03-28 NOTE — Telephone Encounter (Signed)
Patient's wife returned Regina's call.  I let her know rx was called in to the pharmacy and if patient doesn't improve, to call back and schedule appointment.

## 2016-04-10 ENCOUNTER — Ambulatory Visit (INDEPENDENT_AMBULATORY_CARE_PROVIDER_SITE_OTHER)
Admission: RE | Admit: 2016-04-10 | Discharge: 2016-04-10 | Disposition: A | Discharge status: Home / Self Care | Payer: Medicare PPO | Source: Ambulatory Visit | Attending: Family Medicine | Admitting: Family Medicine

## 2016-04-10 ENCOUNTER — Ambulatory Visit (INDEPENDENT_AMBULATORY_CARE_PROVIDER_SITE_OTHER): Payer: Medicare PPO | Admitting: Family Medicine

## 2016-04-10 ENCOUNTER — Encounter: Payer: Self-pay | Admitting: Family Medicine

## 2016-04-10 VITALS — BP 110/74 | HR 89 | Temp 97.5°F | Ht 71.25 in | Wt 233.5 lb

## 2016-04-10 DIAGNOSIS — M545 Low back pain: Secondary | ICD-10-CM | POA: Diagnosis not present

## 2016-04-10 DIAGNOSIS — M5432 Sciatica, left side: Secondary | ICD-10-CM

## 2016-04-10 MED ORDER — DICLOFENAC SODIUM 75 MG PO TBEC: 75.0000 mg | DELAYED_RELEASE_TABLET | Freq: Two times a day (BID) | ORAL | 0 refills | Status: DC

## 2016-04-10 MED ORDER — HYDROCODONE-ACETAMINOPHEN 5-325 MG PO TABS: 1.0000 | ORAL_TABLET | Freq: Two times a day (BID) | ORAL | 0 refills | Status: DC | PRN

## 2016-04-10 NOTE — Progress Notes (Signed)
Pre visit review using our clinic review tool, if applicable. No additional management support is needed unless otherwise documented below in the visit note. 

## 2016-04-10 NOTE — Progress Notes (Signed)
   Subjective:    Patient ID: Christopher [name], male    DOB: 1945-05-29, 71 y.o.   MRN: [id number]  HPI   71 year old male presents for  continued left leg sciatica  He saw Dr. Para Marchuncan on 03/21/2016 with similar issues.  Pain had started 4 days prior to that visit.Marland Kitchen. No falls, no known injury.  L buttock pain radiating to left foot.  Started on pred taper ( starting at 40 mg), back exercises and handout given.   Today he reports he continues to have pain in left buttock to left leg. 7/10 on pain scale.  No  Central low back pain.  No numbness, no weakness.  Worse with walking, not with bending. Aleve did not help either.  Prednisone and tramadol did not help a bit.  Home PT for 3-4 days, stopped since it didn't help.  2011 similar issue in right leg. Went away with time and med.  No past back surgeries, no past steroid injections.    Review of Systems     Objective:   Physical Exam  Constitutional: Vital signs are normal. He appears well-developed and well-nourished.  HENT:  Head: Normocephalic.  Right Ear: Hearing normal.  Left Ear: Hearing normal.  Nose: Nose normal.  Mouth/Throat: Oropharynx is clear and moist and mucous membranes are normal.  Neck: Trachea normal. Carotid bruit is not present. No thyroid mass and no thyromegaly present.  Cardiovascular: Normal rate, regular rhythm and normal pulses.  Exam reveals no gallop, no distant heart sounds and no friction rub.   No murmur heard. No peripheral edema  Pulmonary/Chest: Effort normal and breath sounds normal. No respiratory distress.  Musculoskeletal:       Thoracic back: Normal.       Lumbar back: He exhibits tenderness. He exhibits normal range of motion and no bony tenderness.  ttp in left sciatic notch, neg SLR, neg faber's  Neurological: He has normal strength. No cranial nerve deficit or sensory deficit. Coordination and gait normal.  Skin: Skin is warm, dry and intact. No rash noted.  Psychiatric: He has a  normal mood and affect. His speech is normal and behavior is normal. Thought content normal.          Assessment & Plan:

## 2016-04-10 NOTE — Assessment & Plan Note (Signed)
Pt states minimal improvement but he is not very disabled from pain on exam.  Full ROM.   Will eval with X-ray.  Restart home PT ( pt refused formal PT)  Start diclofenac 75 mg BID.  Heat on low back.  Can use hydrocodone prn in limited fashion.

## 2016-04-10 NOTE — Patient Instructions (Addendum)
Stop at X-ray on way out, we will call with results.  Start diclofenac 75 mg twice daily for pain and inflammation x at least 1 -2 weeks.  For breakthough pain you can use hydrocodone.  Restart back home physical therapy.   Get back to walking as soon as able.

## 2016-04-21 ENCOUNTER — Other Ambulatory Visit: Payer: Self-pay | Admitting: Family Medicine

## 2016-04-22 NOTE — Telephone Encounter (Signed)
Last office visit 04/10/2016.  Last filled 04/20/2016 for #30 with no refills. Refill?

## 2016-05-12 ENCOUNTER — Other Ambulatory Visit: Payer: Self-pay | Admitting: Family Medicine

## 2016-05-12 NOTE — Telephone Encounter (Signed)
Last office visit 04/10/2016.  Last refilled 04/22/2016 for #30 with no refills.  Ok refill?

## 2016-08-05 LAB — FECAL OCCULT BLOOD, GUAIAC: Fecal Occult Blood: NEGATIVE

## 2016-08-25 ENCOUNTER — Encounter: Payer: Self-pay | Admitting: Family Medicine

## 2017-01-13 ENCOUNTER — Ambulatory Visit (INDEPENDENT_AMBULATORY_CARE_PROVIDER_SITE_OTHER): Payer: Medicare PPO

## 2017-01-13 VITALS — BP 100/80 | HR 74 | Temp 97.8°F | Ht 71.5 in | Wt 231.5 lb

## 2017-01-13 DIAGNOSIS — E7800 Pure hypercholesterolemia, unspecified: Secondary | ICD-10-CM

## 2017-01-13 DIAGNOSIS — Z23 Encounter for immunization: Secondary | ICD-10-CM

## 2017-01-13 DIAGNOSIS — Z125 Encounter for screening for malignant neoplasm of prostate: Secondary | ICD-10-CM | POA: Diagnosis not present

## 2017-01-13 DIAGNOSIS — Z8042 Family history of malignant neoplasm of prostate: Secondary | ICD-10-CM | POA: Diagnosis not present

## 2017-01-13 DIAGNOSIS — Z0000 Encounter for general adult medical examination without abnormal findings: Secondary | ICD-10-CM | POA: Diagnosis not present

## 2017-01-13 LAB — COMPREHENSIVE METABOLIC PANEL
ALT: 15 U/L (ref 0–53)
AST: 16 U/L (ref 0–37)
Albumin: 4.3 g/dL (ref 3.5–5.2)
Alkaline Phosphatase: 66 U/L (ref 39–117)
BUN: 12 mg/dL (ref 6–23)
CHLORIDE: 104 meq/L (ref 96–112)
CO2: 28 meq/L (ref 19–32)
CREATININE: 1.24 mg/dL (ref 0.40–1.50)
Calcium: 9.6 mg/dL (ref 8.4–10.5)
GFR: 73.65 mL/min (ref >60.00)
GLUCOSE: 91 mg/dL (ref 70–99)
Potassium: 4.5 mEq/L (ref 3.5–5.1)
SODIUM: 139 meq/L (ref 135–145)
Total Bilirubin: 1 mg/dL (ref 0.2–1.2)
Total Protein: 6.8 g/dL (ref 6.0–8.3)

## 2017-01-13 LAB — CBC WITH DIFFERENTIAL/PLATELET
BASOS PCT: 0.9 % (ref 0.0–3.0)
Basophils Absolute: 0.1 10*3/uL (ref 0.0–0.1)
EOS ABS: 0.2 10*3/uL (ref 0.0–0.7)
Eosinophils Relative: 2.3 % (ref 0.0–5.0)
HEMATOCRIT: 40 % (ref 39.0–52.0)
Hemoglobin: 13.3 g/dL (ref 13.0–17.0)
Lymphocytes Relative: 38.5 % (ref 12.0–46.0)
Lymphs Abs: 2.5 10*3/uL (ref 0.7–4.0)
MCHC: 33.3 g/dL (ref 30.0–36.0)
MCV: 101.2 fl — ABNORMAL HIGH (ref 78.0–100.0)
MONO ABS: 0.6 10*3/uL (ref 0.1–1.0)
Monocytes Relative: 9.2 % (ref 3.0–12.0)
NEUTROS ABS: 3.2 10*3/uL (ref 1.4–7.7)
NEUTROS PCT: 49.1 % (ref 43.0–77.0)
PLATELETS: 188 10*3/uL (ref 150.0–400.0)
RBC: 3.96 Mil/uL — ABNORMAL LOW (ref 4.22–5.81)
RDW: 12.1 % (ref 11.5–15.5)
WBC: 6.5 10*3/uL (ref 4.0–10.5)

## 2017-01-13 LAB — LIPID PANEL
CHOL/HDL RATIO: 6
Cholesterol: 195 mg/dL (ref 0–200)
HDL: 34.4 mg/dL — ABNORMAL LOW (ref >39.00)
LDL CALC: 131 mg/dL — AB (ref 0–99)
NonHDL: 160.46
Triglycerides: 146 mg/dL (ref 0.0–149.0)
VLDL: 29.2 mg/dL (ref 0.0–40.0)

## 2017-01-13 LAB — PSA, MEDICARE: PSA: 1.33 ng/ml (ref 0.10–4.00)

## 2017-01-13 NOTE — Progress Notes (Signed)
PCP notes:   Health maintenance:  Flu vaccine - administered  Abnormal screenings:   Depression score: 3  Patient concerns:   None  Nurse concerns:  None  Next PCP appt:   01/30/17 @ 1500

## 2017-01-13 NOTE — Progress Notes (Signed)
Subjective:   Christopher [name] is a 71 y.o. male who presents for Medicare Annual/Subsequent preventive examination.  Review of Systems:  N/A Cardiac Risk Factors include: advanced age (>6355men, 59>65 women);obesity (BMI >30kg/m2);male gender;dyslipidemia     Objective:    Vitals: BP 100/80 (BP Location: Right Arm, Patient Position: Sitting, Cuff Size: Normal)   Pulse 74   Temp 97.8 F (36.6 C) (Oral)   Ht 5' 11.5" (1.816 m) Comment: no shoes  Wt 231 lb 8 oz (105 kg)   SpO2 97%   BMI 31.84 kg/m   Body mass index is 31.84 kg/m.  Tobacco Social History   Tobacco Use  Smoking Status Former Smoker  . Packs/day: 1.00  . Years: 16.00  . Pack years: 16.00  . Types: Cigarettes  . Last attempt to quit: 06/29/1982  . Years since quitting: 34.5  Smokeless Tobacco Never Used     Counseling given: No   Past Medical History:  Diagnosis Date  . History of chicken pox    History reviewed. No pertinent surgical history. Family History  Problem Relation Age of Onset  . Diabetes Mother   . Heart disease Father    Social History   Substance and Sexual Activity  Sexual Activity Yes    Outpatient Encounter Medications as of 01/13/2017  Medication Sig  . aspirin EC 81 MG tablet Take 81 mg by mouth once as needed.  . diclofenac (VOLTAREN) 75 MG EC tablet TAKE 1 TABLET BY MOUTH TWICE A DAY  . Doxylamine Succinate, Sleep, (SLEEP AID PO) Take 1 tablet by mouth at bedtime as needed.  Marland Kitchen. HYDROcodone-acetaminophen (NORCO/VICODIN) 5-325 MG tablet Take 1 tablet by mouth every 12 (twelve) hours as needed for moderate pain or severe pain.  . Multiple Vitamin (MULTIVITAMIN WITH MINERALS) TABS tablet Take 1 tablet by mouth daily.  . predniSONE (DELTASONE) 20 MG tablet Take 2 a day for 4 days, then 1 a day for 4 days, then 0.5 a day for 4 days.  With food.  . traMADol (ULTRAM) 50 MG tablet Take 1 tablet (50 mg total) by mouth every 8 (eight) hours as needed. For pain.  Sedation caution.   No  facility-administered encounter medications on file as of 01/13/2017.     Activities of Daily Living In your present state of health, do you have any difficulty performing the following activities: 01/13/2017  Hearing? N  Vision? N  Difficulty concentrating or making decisions? N  Walking or climbing stairs? N  Dressing or bathing? N  Doing errands, shopping? N  Preparing Food and eating ? N  Using the Toilet? N  In the past six months, have you accidently leaked urine? N  Do you have problems with loss of bowel control? N  Managing your Medications? N  Managing your Finances? N  Housekeeping or managing your Housekeeping? N  Some recent data might be hidden    Patient Care Team: Excell SeltzerBedsole, Amy E, MD as PCP - General (Family Medicine) Blair PromiseBulakowski, Neill, OD as Consulting Physician (Optometry)   Assessment:     Hearing Screening   125Hz  250Hz  500Hz  1000Hz  2000Hz  3000Hz  4000Hz  6000Hz  8000Hz   Right ear:   40 40 40  40    Left ear:   40 40 40  40      Visual Acuity Screening   Right eye Left eye Both eyes  Without correction: 20/20 20/25-1 20/20  With correction:       Exercise Activities and Dietary recommendations Current Exercise Habits: The  patient does not participate in regular exercise at present, Exercise limited by: None identified  Goals    . DIET - INCREASE WATER INTAKE     Starting 01/13/2017, I will continue to drink at least 6 glasses of water daily.       Fall Risk Fall Risk  01/13/2017 11/08/2015  Falls in the past year? No No   Depression Screen PHQ 2/9 Scores 01/13/2017 11/08/2015  PHQ - 2 Score 0 0  PHQ- 9 Score 3 -    Cognitive Function MMSE - Mini Mental State Exam 01/13/2017 11/08/2015  Orientation to time 5 5  Orientation to Place 5 5  Registration 3 3  Attention/ Calculation 0 0  Recall 3 3  Language- name 2 objects 0 0  Language- repeat 1 1  Language- follow 3 step command 3 3  Language- read & follow direction 0 0  Write a sentence 0  0  Copy design 0 0  Total score 20 20       PLEASE NOTE: A Mini-Cog screen was completed. Maximum score is 20. A value of 0 denotes this part of Folstein MMSE was not completed or the patient failed this part of the Mini-Cog screening.   Mini-Cog Screening Orientation to Time - Max 5 pts Orientation to Place - Max 5 pts Registration - Max 3 pts Recall - Max 3 pts Language Repeat - Max 1 pts Language Follow 3 Step Command - Max 3 pts   Immunization History  Administered Date(s) Administered  . Influenza,inj,Quad PF,6+ Mos 03/21/2016, 01/13/2017  . Pneumococcal Conjugate-13 10/05/2014  . Pneumococcal Polysaccharide-23 11/08/2015  . Tdap 07/29/2013  . Varicella 02/25/2012   Screening Tests Health Maintenance  Topic Date Due  . COLON CANCER SCREENING ANNUAL FOBT  08/05/2017  . TETANUS/TDAP  07/30/2023  . INFLUENZA VACCINE  Completed  . Hepatitis C Screening  Completed  . PNA vac Low Risk Adult  Completed      Plan:     I have personally reviewed, addressed, and noted the following in the patient's chart:  A. Medical and social history B. Use of alcohol, tobacco or illicit drugs  C. Current medications and supplements D. Functional ability and status E.  Nutritional status F.  Physical activity G. Advance directives H. List of other physicians I.  Hospitalizations, surgeries, and ER visits in previous 12 months J.  Vitals K. Screenings to include hearing, vision, cognitive, depression L. Referrals and appointments - none  In addition, I have reviewed and discussed with patient certain preventive protocols, quality metrics, and best practice recommendations. A written personalized care plan for preventive services as well as general preventive health recommendations were provided to patient.  See attached scanned questionnaire for additional information.   Signed,   Randa EvensLesia Azariah Bonura, MHA, BS, LPN Health Coach

## 2017-01-13 NOTE — Progress Notes (Signed)
I reviewed health advisor's note, was available for consultation, and agree with documentation and plan.   Signed,  Mal Asher T. Maeleigh Buschman, MD  

## 2017-01-13 NOTE — Patient Instructions (Addendum)
Christopher [name] , Thank you for taking time to come for your Medicare Wellness Visit. I appreciate your ongoing commitment to your health goals. Please review the following plan we discussed and let me know if I can assist you in the future.   These are the goals we discussed: Goals    . DIET - INCREASE WATER INTAKE     Starting 01/13/2017, I will continue to drink at least 6 glasses of water daily.        This is a list of the screening recommended for you and due dates:  Health Maintenance  Topic Date Due  . Stool Blood Test  08/05/2017  . Tetanus Vaccine  07/30/2023  . Flu Shot  Completed  .  Hepatitis C: One time screening is recommended by Center for Disease Control  (CDC) for  adults born from [birth year] through 1966.   Completed  . Pneumonia vaccines  Completed   Preventive Care for Adults  A healthy lifestyle and preventive care can promote health and wellness. Preventive health guidelines for adults include the following key practices.  . A routine yearly physical is a good way to check with your health care provider about your health and preventive screening. It is a chance to share any concerns and updates on your health and to receive a thorough exam.  . Visit your dentist for a routine exam and preventive care every 6 months. Brush your teeth twice a day and floss once a day. Good oral hygiene prevents tooth decay and gum disease.  . The frequency of eye exams is based on your age, health, family medical history, use  of contact lenses, and other factors. Follow your health care provider's recommendations for frequency of eye exams.  . Eat a healthy diet. Foods like vegetables, fruits, whole grains, low-fat dairy products, and lean protein foods contain the nutrients you need without too many calories. Decrease your intake of foods high in solid fats, added sugars, and salt. Eat the right amount of calories for you. Get information about a proper diet from your health care  provider, if necessary.  . Regular physical exercise is one of the most important things you can do for your health. Most adults should get at least 150 minutes of moderate-intensity exercise (any activity that increases your heart rate and causes you to sweat) each week. In addition, most adults need muscle-strengthening exercises on 2 or more days a week.  Silver Sneakers may be a benefit available to you. To determine eligibility, you may visit the website: www.silversneakers.com or contact program at [phone] Mon-Fri between 8AM-8PM.   . Maintain a healthy weight. The body mass index (BMI) is a screening tool to identify possible weight problems. It provides an estimate of body fat based on height and weight. Your health care provider can find your BMI and can help you achieve or maintain a healthy weight.   For adults 20 years and older: ? A BMI below 18.5 is considered underweight. ? A BMI of 18.5 to 24.9 is normal. ? A BMI of 25 to 29.9 is considered overweight. ? A BMI of 30 and above is considered obese.   . Maintain normal blood lipids and cholesterol levels by exercising and minimizing your intake of saturated fat. Eat a balanced diet with plenty of fruit and vegetables. Blood tests for lipids and cholesterol should begin at age 72 and be repeated every 5 years. If your lipid or cholesterol levels are high, you are over 50,  or you are at high risk for heart disease, you may need your cholesterol levels checked more frequently. Ongoing high lipid and cholesterol levels should be treated with medicines if diet and exercise are not working.  . If you smoke, find out from your health care provider how to quit. If you do not use tobacco, please do not start.  . If you choose to drink alcohol, please do not consume more than 2 drinks per day. One drink is considered to be 12 ounces (355 mL) of beer, 5 ounces (148 mL) of wine, or 1.5 ounces (44 mL) of liquor.  . If you are 29-79 years  old, ask your health care provider if you should take aspirin to prevent strokes.  . Use sunscreen. Apply sunscreen liberally and repeatedly throughout the day. You should seek shade when your shadow is shorter than you. Protect yourself by wearing long sleeves, pants, a wide-brimmed hat, and sunglasses year round, whenever you are outdoors.  . Once a month, do a whole body skin exam, using a mirror to look at the skin on your back. Tell your health care provider of new moles, moles that have irregular borders, moles that are larger than a pencil eraser, or moles that have changed in shape or color.

## 2017-01-13 NOTE — Progress Notes (Signed)
Pre visit review using our clinic review tool, if applicable. No additional management support is needed unless otherwise documented below in the visit note. 

## 2017-01-19 ENCOUNTER — Telehealth: Payer: Self-pay | Admitting: Family Medicine

## 2017-01-19 DIAGNOSIS — E7800 Pure hypercholesterolemia, unspecified: Secondary | ICD-10-CM

## 2017-01-19 DIAGNOSIS — N182 Chronic kidney disease, stage 2 (mild): Secondary | ICD-10-CM

## 2017-01-19 NOTE — Telephone Encounter (Signed)
-----   Message from Dixie DialsAraceli Valencia, [place] sent at 01/12/2017  1:32 PM EST ----- Regarding: CPE LABS  Patient is scheduled for labs on 01/21/17, can you please order labs. Thank you.

## 2017-01-21 ENCOUNTER — Other Ambulatory Visit: Payer: Medicare PPO

## 2017-01-30 ENCOUNTER — Encounter: Payer: Self-pay | Admitting: Family Medicine

## 2017-01-30 ENCOUNTER — Other Ambulatory Visit: Payer: Self-pay

## 2017-01-30 ENCOUNTER — Ambulatory Visit (INDEPENDENT_AMBULATORY_CARE_PROVIDER_SITE_OTHER): Payer: Medicare PPO | Admitting: Family Medicine

## 2017-01-30 VITALS — BP 100/64 | HR 82 | Temp 97.9°F | Ht 71.5 in | Wt 232.5 lb

## 2017-01-30 DIAGNOSIS — Z0000 Encounter for general adult medical examination without abnormal findings: Secondary | ICD-10-CM | POA: Diagnosis not present

## 2017-01-30 DIAGNOSIS — N182 Chronic kidney disease, stage 2 (mild): Secondary | ICD-10-CM

## 2017-01-30 DIAGNOSIS — E7800 Pure hypercholesterolemia, unspecified: Secondary | ICD-10-CM

## 2017-01-30 NOTE — Assessment & Plan Note (Signed)
Encouraged exercise, weight loss, healthy eating habits.  AHA risk 9 % no indication for statin medication.

## 2017-01-30 NOTE — Assessment & Plan Note (Signed)
Improved from last year.  

## 2017-01-30 NOTE — Progress Notes (Signed)
Subjective:    Patient ID: Christopher MustacheGrady [name], male    DOB: Mar 15, 1945, 71 y.o.   MRN: [id number]  HPI   The patient presents for complete physical and review of chronic health problems.   The patient saw [name], LPN for medicare wellness. Note reviewed in detail and important notes copied below. 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 9% risk of CVD in next 10 years. Lab Results  Component Value Date   CHOL 195 01/13/2017   HDL 34.40 (L) 01/13/2017   LDLCALC 131 (H) 01/13/2017   TRIG 146.0 01/13/2017   CHOLHDL 6 01/13/2017  Using medications without problems: Muscle aches:  Diet compliance: moderate control.. Low chol diet Exercise: some decrease in walking Other complaints:   Social History /Family History/Past Medical History reviewed in detail and updated in EMR if needed. Blood pressure 100/64, pulse 82, temperature 97.9 F (36.6 C), temperature source Oral, height 5' 11.5" (1.816 m), weight 232 lb 8 oz (105.5 kg).   Review of Systems  Constitutional: Negative for fatigue and fever.  HENT: Negative for ear pain.   Eyes: Negative for pain.  Respiratory: Negative for cough and shortness of breath.   Cardiovascular: Negative for chest pain, palpitations and leg swelling.  Gastrointestinal: Negative for abdominal pain.  Genitourinary: Negative for dysuria.  Musculoskeletal: Negative for arthralgias.  Neurological: Negative for syncope, light-headedness and headaches.  Psychiatric/Behavioral: Negative for dysphoric mood.       Objective:   Physical Exam  Constitutional: He appears well-developed and well-nourished.  Non-toxic appearance. He does not appear ill. No distress.  HENT:  Head: Normocephalic and atraumatic.  Right Ear: Hearing, tympanic membrane, external  ear and ear canal normal.  Left Ear: Hearing, tympanic membrane, external ear and ear canal normal.  Nose: Nose normal.  Mouth/Throat: Uvula is midline, oropharynx is clear and moist and mucous membranes are normal.  Eyes: Conjunctivae, EOM and lids are normal. Pupils are equal, round, and reactive to light. Lids are everted and swept, no foreign bodies found.  Neck: Trachea normal, normal range of motion and phonation normal. Neck supple. Carotid bruit is not present. No thyroid mass and no thyromegaly present.  Cardiovascular: Normal rate, regular rhythm, S1 normal, S2 normal, intact distal pulses and normal pulses. Exam reveals no gallop.  No murmur heard. Pulmonary/Chest: Breath sounds normal. He has no wheezes. He has no rhonchi. He has no rales.  Abdominal: Soft. Normal appearance and bowel sounds are normal. There is no hepatosplenomegaly. There is no tenderness. There is no rebound, no guarding and no CVA tenderness. No hernia.  Lymphadenopathy:    He has no cervical adenopathy.  Neurological: He is alert. He has normal strength and normal reflexes. No cranial nerve deficit or sensory deficit. Gait normal.  Skin: Skin is warm, dry and intact. No rash noted.  Psychiatric: He has a normal mood and affect. His speech is normal and behavior is normal. Judgment normal.          Assessment & Plan:  The patient's preventative maintenance and recommended screening tests for an annual wellness exam were reviewed in full today. Brought up to date unless services declined.  Counselled on the importance of diet, exercise, and its role in overall health and mortality. The patient's FH and SH was reviewed,  including their home life, tobacco status, and drug and alcohol status.   Vaccines: uptodate tdap, PNA, flu. Prostate: No prostate cancer in family. Plan stopping PSA, prostate exam given age. Hep C: Neg. Colon: 2008 polyps, repeat in 10 years. Dr. Bosie ClosSchooler.. Neg IFOB  07/2016.Marland Kitchen. Former  smoker: Remotely, asymptomatic.

## 2017-01-30 NOTE — Patient Instructions (Signed)
Get back to regular exercise and continue to work on low cholesterol diet.

## 2017-02-03 ENCOUNTER — Telehealth: Payer: Self-pay | Admitting: Family Medicine

## 2017-02-03 NOTE — Telephone Encounter (Signed)
Copied from CRM [phone]. Topic: Quick Communication - See Telephone Encounter >> Feb 03, 2017 12:14 PM Eston Mouldavis, Alexy Bringle B wrote: CRM for notification. See Telephone encounter for:  Questions about summary of visit from last week dec 7, pt would like a call from Dr or nurse 02/03/17.

## 2017-02-04 NOTE — Telephone Encounter (Addendum)
Spoke with [name].  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 He states he has never been told that he has CKD and it was not discussed at his office visit on 01/30/2017.  Please advise.

## 2017-02-04 NOTE — Telephone Encounter (Signed)
Left message for [name] to return call.  When he calls back, please find out exactly what is questions/concerns are.

## 2017-02-05 NOTE — Telephone Encounter (Signed)
Called pt, unable to reach. Will call back tommorow.

## 2017-02-06 NOTE — Telephone Encounter (Signed)
Answer pt's question in detail.

## 2017-02-10 ENCOUNTER — Encounter: Payer: Self-pay | Admitting: Family Medicine

## 2017-07-23 LAB — IFOBT (OCCULT BLOOD): IFOBT: NEGATIVE

## 2017-12-10 ENCOUNTER — Ambulatory Visit (INDEPENDENT_AMBULATORY_CARE_PROVIDER_SITE_OTHER): Payer: Medicare PPO

## 2017-12-10 DIAGNOSIS — Z23 Encounter for immunization: Secondary | ICD-10-CM

## 2018-01-13 ENCOUNTER — Telehealth: Payer: Self-pay | Admitting: Family Medicine

## 2018-01-13 DIAGNOSIS — E7800 Pure hypercholesterolemia, unspecified: Secondary | ICD-10-CM

## 2018-01-13 DIAGNOSIS — E786 Lipoprotein deficiency: Secondary | ICD-10-CM

## 2018-01-13 NOTE — Telephone Encounter (Signed)
-----   Message from Robert Bellowarlesia R Pinson, LPN sent at 16/10/960411/15/2019  1:16 PM EST ----- Regarding: Labs 11/21 Lab orders needed. Thank you.  Insurance:  [employer]

## 2018-01-14 ENCOUNTER — Ambulatory Visit: Payer: Medicare PPO

## 2018-01-14 ENCOUNTER — Ambulatory Visit (INDEPENDENT_AMBULATORY_CARE_PROVIDER_SITE_OTHER): Payer: Medicare PPO

## 2018-01-14 VITALS — BP 106/74 | HR 77 | Temp 97.8°F | Ht 71.25 in | Wt 233.5 lb

## 2018-01-14 DIAGNOSIS — Z0000 Encounter for general adult medical examination without abnormal findings: Secondary | ICD-10-CM

## 2018-01-14 DIAGNOSIS — E7800 Pure hypercholesterolemia, unspecified: Secondary | ICD-10-CM | POA: Diagnosis not present

## 2018-01-14 LAB — COMPREHENSIVE METABOLIC PANEL
ALT: 14 U/L (ref 0–53)
AST: 16 U/L (ref 0–37)
Albumin: 4.3 g/dL (ref 3.5–5.2)
Alkaline Phosphatase: 64 U/L (ref 39–117)
BUN: 15 mg/dL (ref 6–23)
CHLORIDE: 105 meq/L (ref 96–112)
CO2: 27 mEq/L (ref 19–32)
CREATININE: 1.46 mg/dL (ref 0.40–1.50)
Calcium: 9.4 mg/dL (ref 8.4–10.5)
GFR: 60.83 mL/min (ref >60.00)
GLUCOSE: 98 mg/dL (ref 70–99)
POTASSIUM: 4.2 meq/L (ref 3.5–5.1)
SODIUM: 140 meq/L (ref 135–145)
Total Bilirubin: 0.9 mg/dL (ref 0.2–1.2)
Total Protein: 7.3 g/dL (ref 6.0–8.3)

## 2018-01-14 LAB — LIPID PANEL
Cholesterol: 182 mg/dL (ref 0–200)
HDL: 32.8 mg/dL — ABNORMAL LOW (ref >39.00)
LDL CALC: 125 mg/dL — AB (ref 0–99)
NonHDL: 149.5
Total CHOL/HDL Ratio: 6
Triglycerides: 121 mg/dL (ref 0.0–149.0)
VLDL: 24.2 mg/dL (ref 0.0–40.0)

## 2018-01-14 NOTE — Progress Notes (Signed)
PCP notes:   Health maintenance:  No gaps identified.  Abnormal screenings:   None  Patient concerns:   None  Nurse concerns:  None  Next PCP appt:   02/02/18 @ 1000  I reviewed health advisor's note, was available for consultation on the day of service listed in this note, and agree with documentation and plan. Crawford GivensGraham Duncan, MD.

## 2018-01-14 NOTE — Patient Instructions (Signed)
[name] , Thank you for taking time to come for your Medicare Wellness Visit. I appreciate your ongoing commitment to your health goals. Please review the following plan we discussed and let me know if I can assist you in the future.   These are the goals we discussed: Goals    . Increase physical activity     Starting 01/14/2018, I will continue to do strength training for 10 minutes 3-4 days per week.        This is a list of the screening recommended for you and due dates:  Health Maintenance  Topic Date Due  . Colon Cancer Screening  11/07/2025*  . Stool Blood Test  07/18/2018  . Tetanus Vaccine  07/30/2023  . Flu Shot  Completed  .  Hepatitis C: One time screening is recommended by Center for Disease Control  (CDC) for  adults born from [birth year] through 1965.   Completed  . Pneumonia vaccines  Completed  *Topic was postponed. The date shown is not the original due date.   Preventive Care for Adults  A healthy lifestyle and preventive care can promote health and wellness. Preventive health guidelines for adults include the following key practices.  . A routine yearly physical is a good way to check with your health care provider about your health and preventive screening. It is a chance to share any concerns and updates on your health and to receive a thorough exam.  . Visit your dentist for a routine exam and preventive care every 6 months. Brush your teeth twice a day and floss once a day. Good oral hygiene prevents tooth decay and gum disease.  . The frequency of eye exams is based on your age, health, family medical history, use  of contact lenses, and other factors. Follow your health care provider's recommendations for frequency of eye exams.  . Eat a healthy diet. Foods like vegetables, fruits, whole grains, low-fat dairy products, and lean protein foods contain the nutrients you need without too many calories. Decrease your intake of foods high in solid fats, added  sugars, and salt. Eat the right amount of calories for you. Get information about a proper diet from your health care provider, if necessary.  . Regular physical exercise is one of the most important things you can do for your health. Most adults should get at least 150 minutes of moderate-intensity exercise (any activity that increases your heart rate and causes you to sweat) each week. In addition, most adults need muscle-strengthening exercises on 2 or more days a week.  Silver Sneakers may be a benefit available to you. To determine eligibility, you may visit the website: www.silversneakers.com or contact program at (905)[phone] Mon-Fri between 8AM-8PM.   . Maintain a healthy weight. The body mass index (BMI) is a screening tool to identify possible weight problems. It provides an estimate of body fat based on height and weight. Your health care provider can find your BMI and can help you achieve or maintain a healthy weight.   For adults 20 years and older: ? A BMI below 18.5 is considered underweight. ? A BMI of 18.5 to 24.9 is normal. ? A BMI of 25 to 29.9 is considered overweight. ? A BMI of 30 and above is considered obese.   . Maintain normal blood lipids and cholesterol levels by exercising and minimizing your intake of saturated fat. Eat a balanced diet with plenty of fruit and vegetables. Blood tests for lipids and cholesterol should begin at  age 55 and be repeated every 5 years. If your lipid or cholesterol levels are high, you are over 50, or you are at high risk for heart disease, you may need your cholesterol levels checked more frequently. Ongoing high lipid and cholesterol levels should be treated with medicines if diet and exercise are not working.  . If you smoke, find out from your health care provider how to quit. If you do not use tobacco, please do not start.  . If you choose to drink alcohol, please do not consume more than 2 drinks per day. One drink is considered to  be 12 ounces (355 mL) of beer, 5 ounces (148 mL) of wine, or 1.5 ounces (44 mL) of liquor.  . If you are 76-73 years old, ask your health care provider if you should take aspirin to prevent strokes.  . Use sunscreen. Apply sunscreen liberally and repeatedly throughout the day. You should seek shade when your shadow is shorter than you. Protect yourself by wearing long sleeves, pants, a wide-brimmed hat, and sunglasses year round, whenever you are outdoors.  . Once a month, do a whole body skin exam, using a mirror to look at the skin on your back. Tell your health care provider of new moles, moles that have irregular borders, moles that are larger than a pencil eraser, or moles that have changed in shape or color.

## 2018-01-14 NOTE — Progress Notes (Signed)
Subjective:   Christopher [name] is a 72 y.o. male who presents for Medicare Annual/Subsequent preventive examination.  Review of Systems:  N/A Cardiac Risk Factors include: advanced age (>11men, >36 women);obesity (BMI >30kg/m2);dyslipidemia;male gender     Objective:    Vitals: BP 106/74 (BP Location: Right Arm, Patient Position: Sitting, Cuff Size: Large)   Pulse 77   Temp 97.8 F (36.6 C) (Oral)   Ht 5' 11.25" (1.81 m) Comment: no shoes  Wt 233 lb 8 oz (105.9 kg)   SpO2 98%   BMI 32.34 kg/m   Body mass index is 32.34 kg/m.  Advanced Directives 01/14/2018 01/13/2017 11/08/2015  Does Patient Have a Medical Advance Directive? No No Yes  Type of Advance Directive - - Healthcare Power of Attorney  Does patient want to make changes to medical advance directive? - - No - Patient declined  Copy of Healthcare Power of Attorney in Chart? - - No - copy requested  Would patient like information on creating a medical advance directive? No - Patient declined No - Patient declined -    Tobacco Social History   Tobacco Use  Smoking Status Former Smoker  . Packs/day: 1.00  . Years: 16.00  . Pack years: 16.00  . Types: Cigarettes  . Last attempt to quit: 06/29/1982  . Years since quitting: 35.5  Smokeless Tobacco Never Used     Counseling given: No   Clinical Intake:  Pre-visit preparation completed: Yes  Pain : No/denies pain Pain Score: 0-No pain     Nutritional Status: BMI > 30  Obese Nutritional Risks: None Diabetes: No  How often do you need to have someone help you when you read instructions, pamphlets, or other written materials from your doctor or pharmacy?: 1 - Never What is the last grade level you completed in school?: 12th grade  Interpreter Needed?: No  Information entered by :: LPinson, LPN  Past Medical History:  Diagnosis Date  . History of chicken pox    History reviewed. No pertinent surgical history. Family History  Problem Relation Age of  Onset  . Diabetes Mother   . Heart disease Father    Social History   Socioeconomic History  . Marital status: Legally Separated    Spouse name: Not on file  . Number of children: Not on file  . Years of education: Not on file  . Highest education level: Not on file  Occupational History  . Not on file  Social Needs  . Financial resource strain: Not on file  . Food insecurity:    Worry: Not on file    Inability: Not on file  . Transportation needs:    Medical: Not on file    Non-medical: Not on file  Tobacco Use  . Smoking status: Former Smoker    Packs/day: 1.00    Years: 16.00    Pack years: 16.00    Types: Cigarettes    Last attempt to quit: 06/29/1982    Years since quitting: 35.5  . Smokeless tobacco: Never Used  Substance and Sexual Activity  . Alcohol use: Yes    Alcohol/week: 3.0 standard drinks    Types: 3 Cans of beer per week  . Drug use: No  . Sexual activity: Yes  Lifestyle  . Physical activity:    Days per week: Not on file    Minutes per session: Not on file  . Stress: Not on file  Relationships  . Social connections:    Talks on phone: Not  on file    Gets together: Not on file    Attends religious service: Not on file    Active member of club or organization: Not on file    Attends meetings of clubs or organizations: Not on file    Relationship status: Not on file  Other Topics Concern  . Not on file  Social History Narrative   Divorced, Has 5 children.    Outpatient Encounter Medications as of 01/14/2018  Medication Sig  . aspirin EC 81 MG tablet Take 81 mg by mouth once as needed.  . Doxylamine Succinate, Sleep, (SLEEP AID PO) Take 1 tablet by mouth at bedtime as needed.  . Multiple Vitamin (MULTIVITAMIN WITH MINERALS) TABS tablet Take 1 tablet by mouth daily.   No facility-administered encounter medications on file as of 01/14/2018.     Activities of Daily Living In your present state of health, do you have any difficulty performing  the following activities: 01/14/2018  Hearing? N  Vision? N  Difficulty concentrating or making decisions? N  Walking or climbing stairs? N  Dressing or bathing? N  Doing errands, shopping? N  Preparing Food and eating ? N  Using the Toilet? N  In the past six months, have you accidently leaked urine? N  Do you have problems with loss of bowel control? N  Managing your Medications? N  Managing your Finances? N  Housekeeping or managing your Housekeeping? N  Some recent data might be hidden    Patient Care Team: Excell SeltzerBedsole, Amy E, MD as PCP - General (Family Medicine) Blair PromiseBulakowski, Neill, OD as Consulting Physician (Optometry)   Assessment:   This is a routine wellness examination for Christopher PuttGrady.   Hearing Screening   125Hz  250Hz  500Hz  1000Hz  2000Hz  3000Hz  4000Hz  6000Hz  8000Hz   Right ear:   40 40 40  40    Left ear:   40 40 40  40      Visual Acuity Screening   Right eye Left eye Both eyes  Without correction: 20/20 20/25 20/25   With correction:     ' Exercise Activities and Dietary recommendations Current Exercise Habits: Home exercise routine, Type of exercise: strength training/weights, Time (Minutes): 10, Frequency (Times/Week): 4, Weekly Exercise (Minutes/Week): 40, Intensity: Mild, Exercise limited by: None identified  Goals    . Increase physical activity     Starting 01/14/2018, I will continue to do strength training for 10 minutes 3-4 days per week.        Fall Risk Fall Risk  01/14/2018 01/13/2017 11/08/2015  Falls in the past year? 0 No No   Depression Screen PHQ 2/9 Scores 01/14/2018 01/13/2017 11/08/2015  PHQ - 2 Score 0 0 0  PHQ- 9 Score 0 3 -    Cognitive Function MMSE - Mini Mental State Exam 01/14/2018 01/13/2017 11/08/2015  Orientation to time 5 5 5   Orientation to Place 5 5 5   Registration 3 3 3   Attention/ Calculation 0 0 0  Recall 3 3 3   Language- name 2 objects 0 0 0  Language- repeat 1 1 1   Language- follow 3 step command 3 3 3   Language- read  & follow direction 0 0 0  Write a sentence 0 0 0  Copy design 0 0 0  Total score 20 20 20      PLEASE NOTE: A Mini-Cog screen was completed. Maximum score is 20. A value of 0 denotes this part of Folstein MMSE was not completed or the patient failed this part of the Mini-Cog  screening.   Mini-Cog Screening Orientation to Time - Max 5 pts Orientation to Place - Max 5 pts Registration - Max 3 pts Recall - Max 3 pts Language Repeat - Max 1 pts Language Follow 3 Step Command - Max 3 pts     Immunization History  Administered Date(s) Administered  . Influenza,inj,Quad PF,6+ Mos 03/21/2016, 01/13/2017, 12/10/2017  . Pneumococcal Conjugate-13 10/05/2014  . Pneumococcal Polysaccharide-23 11/08/2015  . Tdap 07/29/2013  . Zoster 12/06/2012    Screening Tests Health Maintenance  Topic Date Due  . COLONOSCOPY  11/07/2025 (Originally 04/20/2017)  . COLON CANCER SCREENING ANNUAL FOBT  07/18/2018  . TETANUS/TDAP  07/30/2023  . INFLUENZA VACCINE  Completed  . Hepatitis C Screening  Completed  . PNA vac Low Risk Adult  Completed       Plan:     I have personally reviewed, addressed, and noted the following in the patient's chart:  A. Medical and social history B. Use of alcohol, tobacco or illicit drugs  C. Current medications and supplements D. Functional ability and status E.  Nutritional status F.  Physical activity G. Advance directives H. List of other physicians I.  Hospitalizations, surgeries, and ER visits in previous 12 months J.  Vitals K. Screenings to include hearing, vision, cognitive, depression L. Referrals and appointments - none  In addition, I have reviewed and discussed with patient certain preventive protocols, quality metrics, and best practice recommendations. A written personalized care plan for preventive services as well as general preventive health recommendations were provided to patient.  See attached scanned questionnaire for additional information.    Signed,   Randa Evens, MHA, BS, LPN Health Coach

## 2018-01-18 ENCOUNTER — Ambulatory Visit: Payer: Medicare PPO

## 2018-02-02 ENCOUNTER — Encounter: Payer: Medicare PPO | Admitting: Family Medicine

## 2018-02-02 DIAGNOSIS — Z0289 Encounter for other administrative examinations: Secondary | ICD-10-CM

## 2018-04-22 DIAGNOSIS — H52223 Regular astigmatism, bilateral: Secondary | ICD-10-CM | POA: Diagnosis not present

## 2018-04-22 DIAGNOSIS — H524 Presbyopia: Secondary | ICD-10-CM | POA: Diagnosis not present

## 2018-04-22 DIAGNOSIS — H2513 Age-related nuclear cataract, bilateral: Secondary | ICD-10-CM | POA: Diagnosis not present

## 2018-06-28 LAB — FECAL OCCULT BLOOD, GUAIAC: Fecal Occult Blood: NEGATIVE

## 2018-07-21 IMAGING — DX DG LUMBAR SPINE COMPLETE 4+V
5 series · 5 of 5 positions shown · non-contrast
Comparison: Chest x-ray of January 29, 2008 for purposes of rib
counting.

CLINICAL DATA: Three weeks of low back pain with no history of
trauma.

EXAM:
LUMBAR SPINE - COMPLETE 4+ VIEW

[l-spine ap]
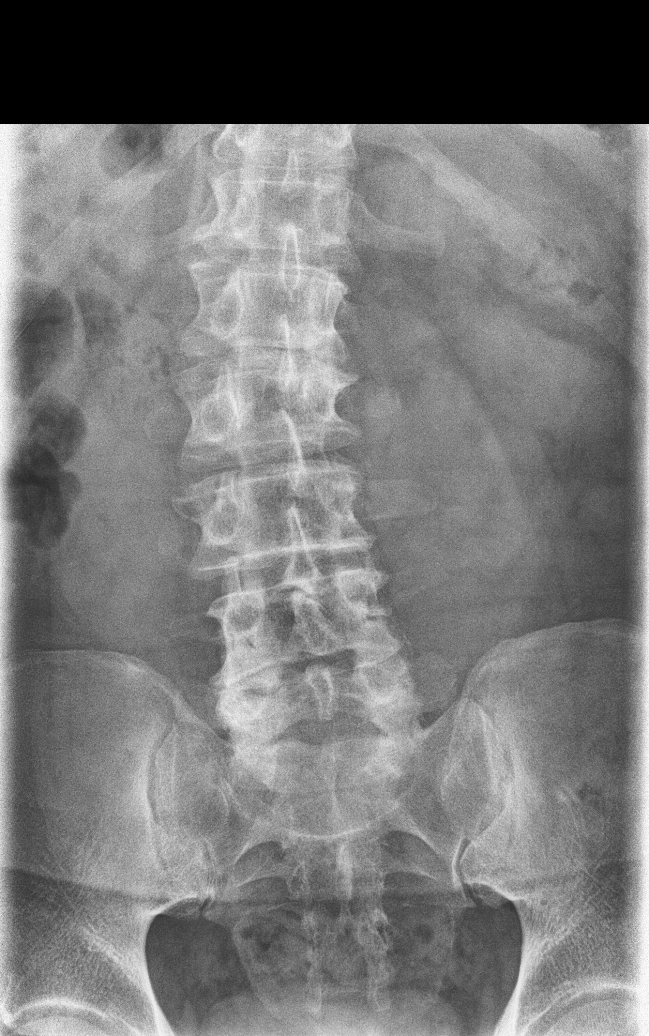

[l-spine obl (1 of 2)]
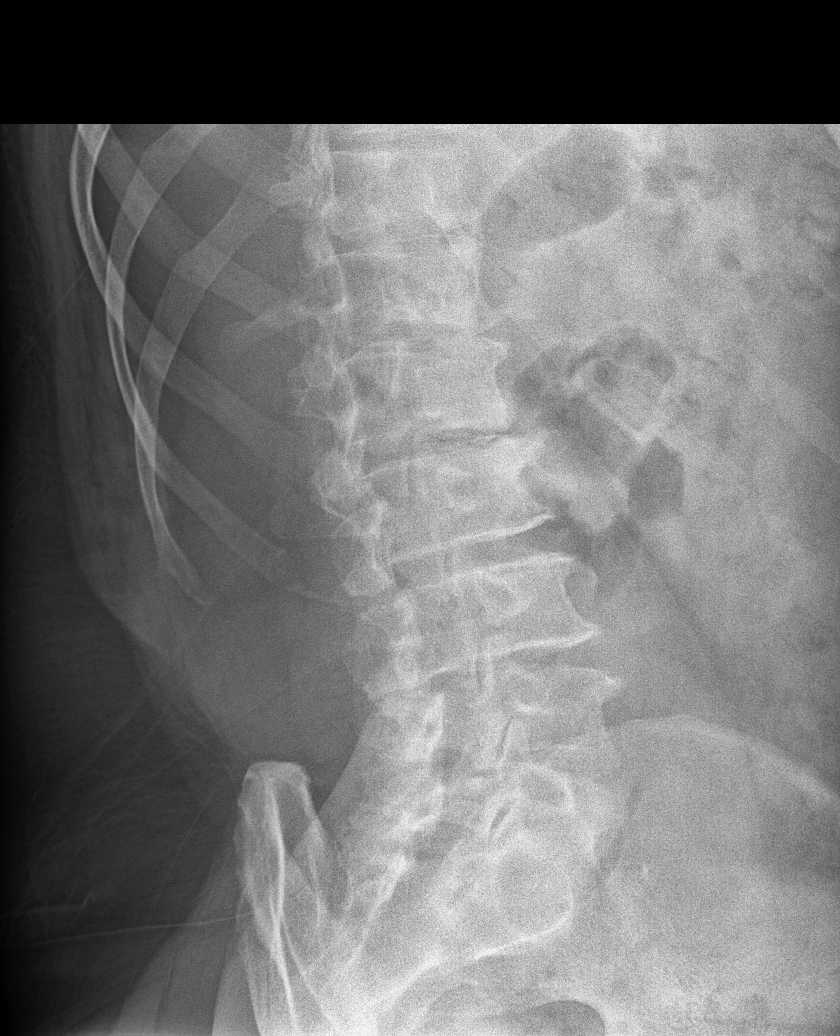

[l-spine obl (2 of 2)]
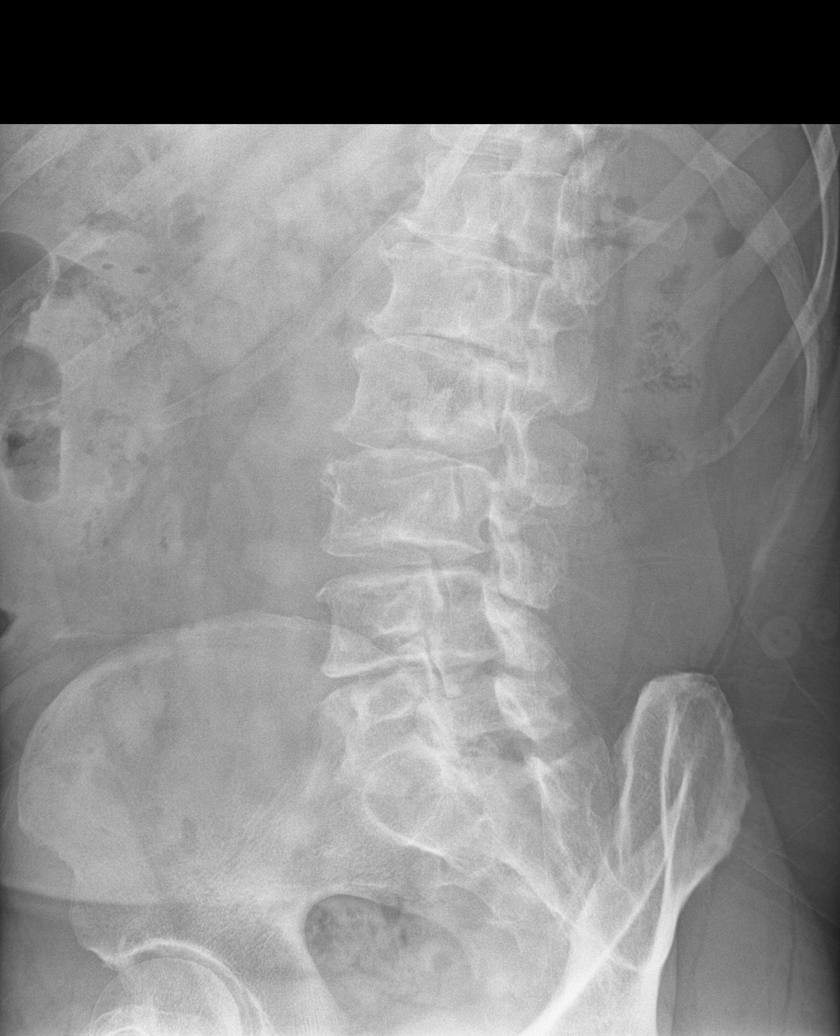

[l-spine lat]
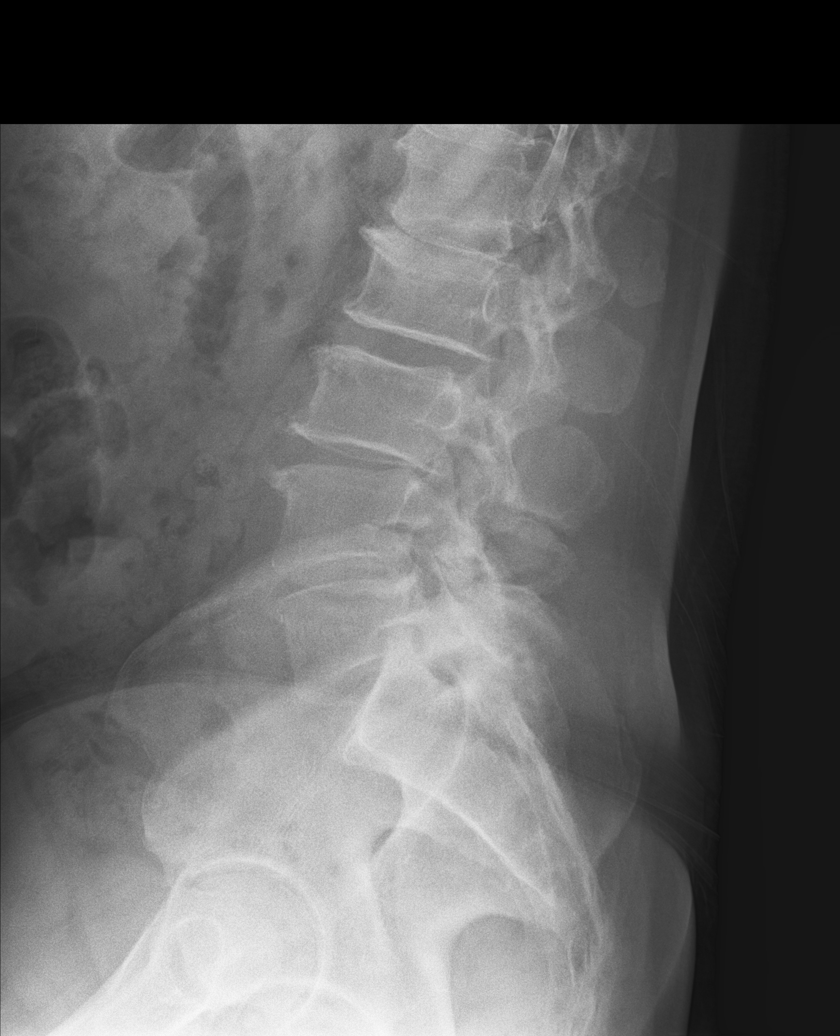

[l-spine l5/s1]
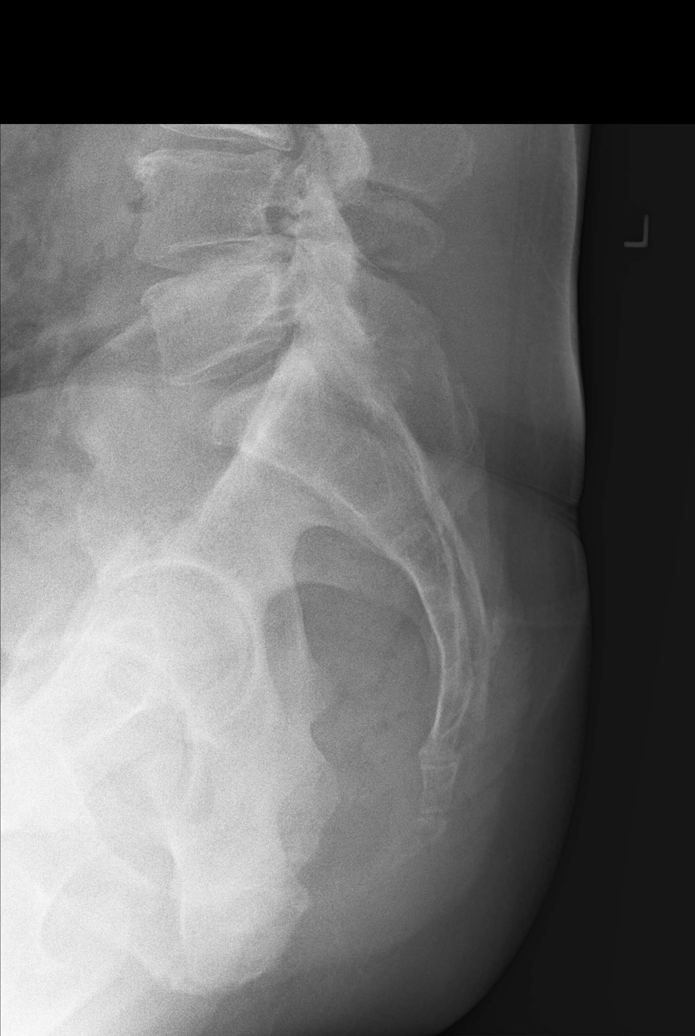

[5 of 5 positions shown; findings below may reference images not displayed]

FINDINGS: There are 6 non rib-bearing vertebral bodies with the lower most
designated as S1. The first ribs are transitional. The lumbar
vertebral bodies are preserved in height. There is degenerative disc
space narrowing at L1-2, L2-3, L4-5, and L5-S1. There is facet joint
hypertrophy at L5-S1 and S1-S2. Small anterior endplate osteophytes
are present at multiple levels. The pedicles and transverse
processes are intact. There is gentle dextrocurvature centered at
L2-3. The observed portions of the sacrum exhibit no acute
abnormalities.
IMPRESSION: Transitional anatomy with S1 being largely sacralized. Multilevel
degenerative disc disease. L5-S1 and S1-S2 facet joint hypertrophy.
No acute fracture nor dislocation.

## 2018-10-18 ENCOUNTER — Encounter: Payer: Self-pay | Admitting: Family Medicine

## 2018-10-28 ENCOUNTER — Other Ambulatory Visit: Payer: Self-pay

## 2018-10-28 ENCOUNTER — Ambulatory Visit (INDEPENDENT_AMBULATORY_CARE_PROVIDER_SITE_OTHER): Payer: Medicare PPO

## 2018-10-28 DIAGNOSIS — Z23 Encounter for immunization: Secondary | ICD-10-CM

## 2019-01-17 ENCOUNTER — Telehealth: Payer: Self-pay | Admitting: Family Medicine

## 2019-01-17 DIAGNOSIS — E7800 Pure hypercholesterolemia, unspecified: Secondary | ICD-10-CM

## 2019-01-17 NOTE — Telephone Encounter (Signed)
-----   Message from Cloyd Stagers, RT sent at 01/13/2019 11:20 AM EST ----- Regarding: Lab Orders for Monday 11.30.2020 Please place lab orders for Monday 11.30.2020, office visit for physical on Friday 12.11.2020 Thank you, Dyke Maes RT(R)

## 2019-01-24 ENCOUNTER — Other Ambulatory Visit: Payer: Self-pay

## 2019-01-24 ENCOUNTER — Ambulatory Visit: Payer: Medicare PPO

## 2019-01-24 ENCOUNTER — Ambulatory Visit (INDEPENDENT_AMBULATORY_CARE_PROVIDER_SITE_OTHER): Payer: Medicare PPO

## 2019-01-24 ENCOUNTER — Other Ambulatory Visit (INDEPENDENT_AMBULATORY_CARE_PROVIDER_SITE_OTHER): Payer: Medicare PPO

## 2019-01-24 DIAGNOSIS — Z0000 Encounter for general adult medical examination without abnormal findings: Secondary | ICD-10-CM | POA: Diagnosis not present

## 2019-01-24 DIAGNOSIS — E7800 Pure hypercholesterolemia, unspecified: Secondary | ICD-10-CM | POA: Diagnosis not present

## 2019-01-24 LAB — COMPREHENSIVE METABOLIC PANEL
ALT: 17 U/L (ref 0–53)
AST: 16 U/L (ref 0–37)
Albumin: 4 g/dL (ref 3.5–5.2)
Alkaline Phosphatase: 61 U/L (ref 39–117)
BUN: 11 mg/dL (ref 6–23)
CO2: 27 mEq/L (ref 19–32)
Calcium: 9.3 mg/dL (ref 8.4–10.5)
Chloride: 103 mEq/L (ref 96–112)
Creatinine, Ser: 1.25 mg/dL (ref 0.40–1.50)
GFR: 68.27 mL/min (ref >60.00)
Glucose, Bld: 92 mg/dL (ref 70–99)
Potassium: 4.2 mEq/L (ref 3.5–5.1)
Sodium: 138 mEq/L (ref 135–145)
Total Bilirubin: 1.1 mg/dL (ref 0.2–1.2)
Total Protein: 6.9 g/dL (ref 6.0–8.3)

## 2019-01-24 LAB — LIPID PANEL
Cholesterol: 218 mg/dL — ABNORMAL HIGH (ref 0–200)
HDL: 36.5 mg/dL — ABNORMAL LOW (ref >39.00)
LDL Cholesterol: 155 mg/dL — ABNORMAL HIGH (ref 0–99)
NonHDL: 181.03
Total CHOL/HDL Ratio: 6
Triglycerides: 132 mg/dL (ref 0.0–149.0)
VLDL: 26.4 mg/dL (ref 0.0–40.0)

## 2019-01-24 NOTE — Progress Notes (Signed)
Subjective:   Christopher [name] is a 73 y.o. male who presents for Medicare Annual/Subsequent preventive examination.  Review of Systems: N/A   This visit is being conducted through telemedicine via telephone at the nurse health advisor's home address due to the COVID-19 pandemic. This patient has given me verbal consent via doximity to conduct this visit, patient states they are participating from their home address. Patient and myself are on the telephone call. There is no referral for this visit. Some vital signs may be absent or patient reported.    Patient identification: identified by name, DOB, and current address   Cardiac Risk Factors include: advanced age (>[id number], 64>65 women);male gender;Other (see comment), Risk factor comments: hypercholesterolemia     Objective:    Vitals: There were no vitals taken for this visit.  There is no height or weight on file to calculate BMI.  Advanced Directives 01/24/2019 01/14/2018 01/13/2017 11/08/2015  Does Patient Have a Medical Advance Directive? No No No Yes  Type of Advance Directive - - Web designer- Healthcare Power of Attorney  Does patient want to make changes to medical advance directive? - - - No - Patient declined  Copy of Healthcare Power of Attorney in Chart? - - - No - copy requested  Would patient like information on creating a medical advance directive? No - Patient declined No - Patient declined No - Patient declined -    Tobacco Social History   Tobacco Use  Smoking Status Former Smoker  . Packs/day: 1.00  . Years: 16.00  . Pack years: 16.00  . Types: Cigarettes  . Quit date: 06/29/1982  . Years since quitting: 36.5  Smokeless Tobacco Never Used     Counseling given: Not Answered   Clinical Intake:  Pre-visit preparation completed: Yes  Pain : No/denies pain     Nutritional Risks: None Diabetes: No  How often do you need to have someone help you when you read instructions, pamphlets, or other written materials from  your doctor or pharmacy?: 1 - Never What is the last grade level you completed in school?: 12th  Interpreter Needed?: No  Information entered by :: CJohnson, LPN  Past Medical History:  Diagnosis Date  . History of chicken pox    History reviewed. No pertinent surgical history. Family History  Problem Relation Age of Onset  . Diabetes Mother   . Heart disease Father    Social History   Socioeconomic History  . Marital status: Legally Separated    Spouse name: Not on file  . Number of children: Not on file  . Years of education: Not on file  . Highest education level: Not on file  Occupational History  . Not on file  Social Needs  . Financial resource strain: Not hard at all  . Food insecurity    Worry: Never true    Inability: Never true  . Transportation needs    Medical: No    Non-medical: No  Tobacco Use  . Smoking status: Former Smoker    Packs/day: 1.00    Years: 16.00    Pack years: 16.00    Types: Cigarettes    Quit date: 06/29/1982    Years since quitting: 36.5  . Smokeless tobacco: Never Used  Substance and Sexual Activity  . Alcohol use: Yes    Alcohol/week: 3.0 standard drinks    Types: 3 Cans of beer per week  . Drug use: No  . Sexual activity: Yes  Lifestyle  . Physical activity  Days per week: 3 days    Minutes per session: 10 min  . Stress: Not at all  Relationships  . Social Musician on phone: Not on file    Gets together: Not on file    Attends religious service: Not on file    Active member of club or organization: Not on file    Attends meetings of clubs or organizations: Not on file    Relationship status: Not on file  Other Topics Concern  . Not on file  Social History Narrative   Divorced, Has 5 children.    Outpatient Encounter Medications as of 01/24/2019  Medication Sig  . aspirin EC 81 MG tablet Take 81 mg by mouth once as needed.  . Doxylamine Succinate, Sleep, (SLEEP AID PO) Take 1 tablet by mouth at  bedtime as needed.  . Multiple Vitamin (MULTIVITAMIN WITH MINERALS) TABS tablet Take 1 tablet by mouth daily.   No facility-administered encounter medications on file as of 01/24/2019.     Activities of Daily Living In your present state of health, do you have any difficulty performing the following activities: 01/24/2019  Hearing? N  Vision? N  Difficulty concentrating or making decisions? N  Walking or climbing stairs? N  Dressing or bathing? N  Doing errands, shopping? N  Preparing Food and eating ? N  Using the Toilet? N  In the past six months, have you accidently leaked urine? N  Do you have problems with loss of bowel control? N  Managing your Medications? N  Managing your Finances? N  Housekeeping or managing your Housekeeping? N  Some recent data might be hidden    Patient Care Team: Excell Seltzer, MD as PCP - General (Family Medicine) Blair Promise, OD as Consulting Physician (Optometry)   Assessment:   This is a routine wellness examination for [name].  Exercise Activities and Dietary recommendations Current Exercise Habits: Home exercise routine, Type of exercise: strength training/weights, Time (Minutes): 15, Frequency (Times/Week): 3, Weekly Exercise (Minutes/Week): 45, Intensity: Mild, Exercise limited by: None identified  Goals    . Increase physical activity     Starting 01/14/2018, I will continue to do strength training for 10 minutes 3-4 days per week.     . Patient Stated     01/24/2019, I will try to work on losing about 20 lbs.        Fall Risk Fall Risk  01/24/2019 01/14/2018 01/13/2017 11/08/2015  Falls in the past year? 0 0 No No  Number falls in past yr: 0 - - -  Injury with Fall? 0 - - -  Follow up Falls evaluation completed;Falls prevention discussed - - -   Is the patient's home free of loose throw rugs in walkways, pet beds, electrical cords, etc?   yes      Grab bars in the bathroom? no      Handrails on the stairs?   no       Adequate lighting?   yes  Timed Get Up and Go Performed: N/A  Depression Screen PHQ 2/9 Scores 01/24/2019 01/14/2018 01/13/2017 11/08/2015  PHQ - 2 Score 0 0 0 0  PHQ- 9 Score 0 0 3 -    Cognitive Function MMSE - Mini Mental State Exam 01/24/2019 01/14/2018 01/13/2017 11/08/2015  Orientation to time 5 5 5 5   Orientation to Place 5 5 5 5   Registration 3 3 3 3   Attention/ Calculation 5 0 0 0  Recall 3 3 3  3  Language- name 2 objects - 0 0 0  Language- repeat 1 1 1 1   Language- follow 3 step command - 3 3 3   Language- read & follow direction - 0 0 0  Write a sentence - 0 0 0  Copy design - 0 0 0  Total score - 20 20 20   Mini Cog  Mini-Cog screen was completed. Maximum score is 22. A value of 0 denotes this part of the MMSE was not completed or the patient failed this part of the Mini-Cog screening.       Immunization History  Administered Date(s) Administered  . Fluad Quad(high Dose 65+) 10/28/2018  . Influenza,inj,Quad PF,6+ Mos 03/21/2016, 01/13/2017, 12/10/2017  . Pneumococcal Conjugate-13 10/05/2014  . Pneumococcal Polysaccharide-23 11/08/2015  . Tdap 07/29/2013  . Zoster 12/06/2012    Qualifies for Shingles Vaccine? Yes  Screening Tests Health Maintenance  Topic Date Due  . COLONOSCOPY  11/07/2025 (Originally 04/20/2017)  . COLON CANCER SCREENING ANNUAL FOBT  06/22/2019  . TETANUS/TDAP  07/30/2023  . INFLUENZA VACCINE  Completed  . Hepatitis C Screening  Completed  . PNA vac Low Risk Adult  Completed   Cancer Screenings: Lung: Low Dose CT Chest recommended if Age 66-80 years, 30 pack-year currently smoking OR have quit w/in 15years. Patient does not qualify. Colorectal: FOBT completed 06/22/2018  Additional Screenings:  Hepatitis C Screening: 11/08/2015      Plan:   Patient wants to work on losing 20 lbs.   I have personally reviewed and noted the following in the patient's chart:   . Medical and social history . Use of alcohol, tobacco or illicit drugs   . Current medications and supplements . Functional ability and status . Nutritional status . Physical activity . Advanced directives . List of other physicians . Hospitalizations, surgeries, and ER visits in previous 12 months . Vitals . Screenings to include cognitive, depression, and falls . Referrals and appointments  In addition, I have reviewed and discussed with patient certain preventive protocols, quality metrics, and best practice recommendations. A written personalized care plan for preventive services as well as general preventive health recommendations were provided to patient.     Andrez Grime, LPN  [id number]

## 2019-01-24 NOTE — Progress Notes (Signed)
PCP notes:  Health Maintenance: Patient wants shingrix if available and covered by his insurance   Abnormal Screenings: none   Patient concerns: none   Nurse concerns: none   Next PCP appt.: 02/04/2019 @ 10 am

## 2019-01-24 NOTE — Patient Instructions (Signed)
Christopher [name] , Thank you for taking time to come for your Medicare Wellness Visit. I appreciate your ongoing commitment to your health goals. Please review the following plan we discussed and let me know if I can assist you in the future.   Screening recommendations/referrals: Colonoscopy: FOBT completed 06/22/2018 Recommended yearly ophthalmology/optometry visit for glaucoma screening and checkup Recommended yearly dental visit for hygiene and checkup  Vaccinations: Influenza vaccine: Up to date, completed 10/28/2018 Pneumococcal vaccine: Completed series Tdap vaccine: Up to date, completed 07/29/2013 Shingles vaccine: will check with insurance    Advanced directives: Advance directive discussed with you today. Even though you declined this today please call our office should you change your mind and we can give you the proper paperwork for you to fill out  Conditions/risks identified: hypercholesterolemia  Next appointment: 02/04/2019 @ 10 am   Preventive Care 65 Years and Older, Male Preventive care refers to lifestyle choices and visits with your health care provider that can promote health and wellness. What does preventive care include?  A yearly physical exam. This is also called an annual well check.  Dental exams once or twice a year.  Routine eye exams. Ask your health care provider how often you should have your eyes checked.  Personal lifestyle choices, including:  Daily care of your teeth and gums.  Regular physical activity.  Eating a healthy diet.  Avoiding tobacco and drug use.  Limiting alcohol use.  Practicing safe sex.  Taking low doses of aspirin every day.  Taking vitamin and mineral supplements as recommended by your health care provider. What happens during an annual well check? The services and screenings done by your health care provider during your annual well check will depend on your age, overall health, lifestyle risk factors, and family  history of disease. Counseling  Your health care provider may ask you questions about your:  Alcohol use.  Tobacco use.  Drug use.  Emotional well-being.  Home and relationship well-being.  Sexual activity.  Eating habits.  History of falls.  Memory and ability to understand (cognition).  Work and work Statistician. Screening  You may have the following tests or measurements:  Height, weight, and BMI.  Blood pressure.  Lipid and cholesterol levels. These may be checked every 5 years, or more frequently if you are over 12 years old.  Skin check.  Lung cancer screening. You may have this screening every year starting at age 74 if you have a 30-pack-year history of smoking and currently smoke or have quit within the past 15 years.  Fecal occult blood test (FOBT) of the stool. You may have this test every year starting at age 74.  Flexible sigmoidoscopy or colonoscopy. You may have a sigmoidoscopy every 5 years or a colonoscopy every 10 years starting at age 74.  Prostate cancer screening. Recommendations will vary depending on your family history and other risks.  Hepatitis C blood test.  Hepatitis B blood test.  Sexually transmitted disease (STD) testing.  Diabetes screening. This is done by checking your blood sugar (glucose) after you have not eaten for a while (fasting). You may have this done every 1-3 years.  Abdominal aortic aneurysm (AAA) screening. You may need this if you are a current or former smoker.  Osteoporosis. You may be screened starting at age 74 if you are at high risk. Talk with your health care provider about your test results, treatment options, and if necessary, the need for more tests. Vaccines  Your health care  provider may recommend certain vaccines, such as:  Influenza vaccine. This is recommended every year.  Tetanus, diphtheria, and acellular pertussis (Tdap, Td) vaccine. You may need a Td booster every 10 years.  Zoster vaccine.  You may need this after age 74.  Pneumococcal 13-valent conjugate (PCV13) vaccine. One dose is recommended after age 74.  Pneumococcal polysaccharide (PPSV23) vaccine. One dose is recommended after age 74. Talk to your health care provider about which screenings and vaccines you need and how often you need them. This information is not intended to replace advice given to you by your health care provider. Make sure you discuss any questions you have with your health care provider. Document Released: 03/09/2015 Document Revised: 10/31/2015 Document Reviewed: 12/12/2014 Elsevier Interactive Patient Education  2017 [place] Prevention in the Home Falls can cause injuries. They can happen to people of all ages. There are many things you can do to make your home safe and to help prevent falls. What can I do on the outside of my home?  Regularly fix the edges of walkways and driveways and fix any cracks.  Remove anything that might make you trip as you walk through a door, such as a raised step or threshold.  Trim any bushes or trees on the path to your home.  Use bright outdoor lighting.  Clear any walking paths of anything that might make someone trip, such as rocks or tools.  Regularly check to see if handrails are loose or broken. Make sure that both sides of any steps have handrails.  Any raised decks and porches should have guardrails on the edges.  Have any leaves, snow, or ice cleared regularly.  Use sand or salt on walking paths during winter.  Clean up any spills in your garage right away. This includes oil or grease spills. What can I do in the bathroom?  Use night lights.  Install grab bars by the toilet and in the tub and shower. Do not use towel bars as grab bars.  Use non-skid mats or decals in the tub or shower.  If you need to sit down in the shower, use a plastic, non-slip stool.  Keep the floor dry. Clean up any water that spills on the floor as soon  as it happens.  Remove soap buildup in the tub or shower regularly.  Attach bath mats securely with double-sided non-slip rug tape.  Do not have throw rugs and other things on the floor that can make you trip. What can I do in the bedroom?  Use night lights.  Make sure that you have a light by your bed that is easy to reach.  Do not use any sheets or blankets that are too big for your bed. They should not hang down onto the floor.  Have a firm chair that has side arms. You can use this for support while you get dressed.  Do not have throw rugs and other things on the floor that can make you trip. What can I do in the kitchen?  Clean up any spills right away.  Avoid walking on wet floors.  Keep items that you use a lot in easy-to-reach places.  If you need to reach something above you, use a strong step stool that has a grab bar.  Keep electrical cords out of the way.  Do not use floor polish or wax that makes floors slippery. If you must use wax, use non-skid floor wax.  Do not have throw  rugs and other things on the floor that can make you trip. What can I do with my stairs?  Do not leave any items on the stairs.  Make sure that there are handrails on both sides of the stairs and use them. Fix handrails that are broken or loose. Make sure that handrails are as long as the stairways.  Check any carpeting to make sure that it is firmly attached to the stairs. Fix any carpet that is loose or worn.  Avoid having throw rugs at the top or bottom of the stairs. If you do have throw rugs, attach them to the floor with carpet tape.  Make sure that you have a light switch at the top of the stairs and the bottom of the stairs. If you do not have them, ask someone to add them for you. What else can I do to help prevent falls?  Wear shoes that:  Do not have high heels.  Have rubber bottoms.  Are comfortable and fit you well.  Are closed at the toe. Do not wear sandals.  If  you use a stepladder:  Make sure that it is fully opened. Do not climb a closed stepladder.  Make sure that both sides of the stepladder are locked into place.  Ask someone to hold it for you, if possible.  Clearly mark and make sure that you can see:  Any grab bars or handrails.  First and last steps.  Where the edge of each step is.  Use tools that help you move around (mobility aids) if they are needed. These include:  Canes.  Walkers.  Scooters.  Crutches.  Turn on the lights when you go into a dark area. Replace any light bulbs as soon as they burn out.  Set up your furniture so you have a clear path. Avoid moving your furniture around.  If any of your floors are uneven, fix them.  If there are any pets around you, be aware of where they are.  Review your medicines with your doctor. Some medicines can make you feel dizzy. This can increase your chance of falling. Ask your doctor what other things that you can do to help prevent falls. This information is not intended to replace advice given to you by your health care provider. Make sure you discuss any questions you have with your health care provider. Document Released: 12/07/2008 Document Revised: 07/19/2015 Document Reviewed: 03/17/2014 Elsevier Interactive Patient Education  2017 [employer].

## 2019-01-25 NOTE — Progress Notes (Signed)
No critical labs need to be addressed urgently. We will discuss labs in detail at upcoming office visit.   

## 2019-02-04 ENCOUNTER — Ambulatory Visit (INDEPENDENT_AMBULATORY_CARE_PROVIDER_SITE_OTHER): Payer: Medicare PPO | Admitting: Family Medicine

## 2019-02-04 ENCOUNTER — Other Ambulatory Visit: Payer: Self-pay

## 2019-02-04 ENCOUNTER — Encounter: Payer: Self-pay | Admitting: Family Medicine

## 2019-02-04 VITALS — BP 100/68 | HR 81 | Temp 98.5°F | Ht 71.5 in | Wt 234.2 lb

## 2019-02-04 DIAGNOSIS — E7800 Pure hypercholesterolemia, unspecified: Secondary | ICD-10-CM

## 2019-02-04 DIAGNOSIS — Z0000 Encounter for general adult medical examination without abnormal findings: Secondary | ICD-10-CM

## 2019-02-04 MED ORDER — ATORVASTATIN CALCIUM 10 MG PO TABS: 10.0000 mg | ORAL_TABLET | Freq: Every day | ORAL | 3 refills | Status: DC

## 2019-02-04 NOTE — Progress Notes (Signed)
Chief Complaint  Patient presents with  . Annual Exam    Part 2    History of Present Illness: HPI  The patient presents for complete physical and review of chronic health problems. He/She also has the following acute concerns today: none  The patient saw a LPN or RN for medicare wellness visit.  Prevention and wellness was reviewed in detail. Note reviewed and important notes copied below. Health Maintenance: Patient wants shingrix if available and covered by his insurance Abnormal Screenings: none  02/04/19   Elevated Cholesterol:   Worsened control this year.Marland Kitchen LDL increased 30 points HDL low. NO 10 year risk at 9.9% risk.. statin indicated. Lab Results  Component Value Date   CHOL 218 (H) 01/24/2019   HDL 36.50 (L) 01/24/2019   LDLCALC 155 (H) 01/24/2019   TRIG 132.0 01/24/2019   CHOLHDL 6 01/24/2019  Using medications without problems: Muscle aches:  Diet compliance: moderate Exercise: weight liftling Other complaints:   This visit occurred during the SARS-CoV-2 public health emergency.  Safety protocols were in place, including screening questions prior to the visit, additional usage of staff PPE, and extensive cleaning of exam room while observing appropriate contact time as indicated for disinfecting solutions.   COVID 19 screen:  No recent travel or known exposure to COVID19 The patient denies respiratory symptoms of COVID 19 at this time. The importance of social distancing was discussed today.     Review of Systems  Constitutional: Negative for chills and fever.  HENT: Negative for congestion and ear pain.   Eyes: Negative for pain and redness.  Respiratory: Negative for cough and shortness of breath.   Cardiovascular: Negative for chest pain, palpitations and leg swelling.  Gastrointestinal: Negative for abdominal pain, blood in stool, constipation, diarrhea, nausea and vomiting.  Genitourinary: Negative for dysuria.  Musculoskeletal: Negative for  falls and myalgias.  Skin: Negative for rash.  Neurological: Negative for dizziness.  Psychiatric/Behavioral: Negative for depression. The patient is not nervous/anxious.       Past Medical History:  Diagnosis Date  . History of chicken pox     reports that he quit smoking about 36 years ago. His smoking use included cigarettes. He has a 16.00 pack-year smoking history. He has never used smokeless tobacco. He reports current alcohol use of about 3.0 standard drinks of alcohol per week. He reports that he does not use drugs.   Current Outpatient Medications:  .  aspirin EC 81 MG tablet, Take 81 mg by mouth once as needed., Disp: , Rfl:  .  Doxylamine Succinate, Sleep, (SLEEP AID PO), Take 1 tablet by mouth at bedtime as needed., Disp: , Rfl:  .  Multiple Vitamin (MULTIVITAMIN WITH MINERALS) TABS tablet, Take 1 tablet by mouth daily., Disp: , Rfl:    Observations/Objective: Blood pressure 100/68, pulse 81, temperature 98.5 F (36.9 C), temperature source Temporal, height 5' 11.5" (1.816 m), weight 234 lb 4 oz (106.3 kg), SpO2 95 %.  Physical Exam Constitutional:      General: He is not in acute distress.    Appearance: Normal appearance. He is well-developed. He is not ill-appearing or toxic-appearing.  HENT:     Head: Normocephalic and atraumatic.     Right Ear: Hearing, tympanic membrane, ear canal and external ear normal.     Left Ear: Hearing, tympanic membrane, ear canal and external ear normal.     Nose: Nose normal.     Mouth/Throat:     Pharynx: Uvula midline.  Eyes:  General: Lids are normal. Lids are everted, no foreign bodies appreciated.     Conjunctiva/sclera: Conjunctivae normal.     Pupils: Pupils are equal, round, and reactive to light.  Neck:     Thyroid: No thyroid mass or thyromegaly.     Vascular: No carotid bruit.     Trachea: Trachea and phonation normal.  Cardiovascular:     Rate and Rhythm: Normal rate and regular rhythm.     Pulses: Normal pulses.      Heart sounds: S1 normal and S2 normal. No murmur. No gallop.   Pulmonary:     Breath sounds: Normal breath sounds. No wheezing, rhonchi or rales.  Abdominal:     General: Bowel sounds are normal.     Palpations: Abdomen is soft.     Tenderness: There is no abdominal tenderness. There is no guarding or rebound.     Hernia: No hernia is present.  Musculoskeletal:     Cervical back: Normal range of motion and neck supple.  Lymphadenopathy:     Cervical: No cervical adenopathy.  Skin:    General: Skin is warm and dry.     Findings: No rash.  Neurological:     Mental Status: He is alert.     Cranial Nerves: No cranial nerve deficit.     Sensory: No sensory deficit.     Gait: Gait normal.     Deep Tendon Reflexes: Reflexes are normal and symmetric.  Psychiatric:        Speech: Speech normal.        Behavior: Behavior normal.        Judgment: Judgment normal.      Assessment and Plan The patient's preventative maintenance and recommended screening tests for an annual wellness exam were reviewed in full today. Brought up to date unless services declined.  Counselled on the importance of diet, exercise, and its role in overall health and mortality. The patient's FH and SH was reviewed, including their home life, tobacco status, and drug and alcohol status.   Vaccines: uptodate tdap, PNA, flu. Will look into shingles vaccine coverage Prostate:No prostate cancer in family. Plan stopping PSA, prostate exam given age. Hep C:Neg. Colon: 2009 polyps, repeat in 10 years. Dr. Bosie Clos.Lysle Rubens IFOB  05/2018 Former smoker:Remotely, asymptomatic.      Kerby Nora, MD

## 2019-02-04 NOTE — Assessment & Plan Note (Signed)
Will start low dose statin medication given at 10% 10 yearCAD risk

## 2019-02-04 NOTE — Patient Instructions (Signed)
Increase the cardiovascular exercise back up.  Low cholesterol diet.  Start low dose atorvastatin daily.

## 2019-05-05 ENCOUNTER — Other Ambulatory Visit: Payer: Self-pay

## 2019-05-05 ENCOUNTER — Other Ambulatory Visit (INDEPENDENT_AMBULATORY_CARE_PROVIDER_SITE_OTHER): Payer: Medicare PPO

## 2019-05-05 DIAGNOSIS — E7800 Pure hypercholesterolemia, unspecified: Secondary | ICD-10-CM | POA: Diagnosis not present

## 2019-05-05 LAB — LIPID PANEL
Cholesterol: 142 mg/dL (ref 0–200)
HDL: 33.9 mg/dL — ABNORMAL LOW (ref >39.00)
LDL Cholesterol: 88 mg/dL (ref 0–99)
NonHDL: 107.9
Total CHOL/HDL Ratio: 4
Triglycerides: 98 mg/dL (ref 0.0–149.0)
VLDL: 19.6 mg/dL (ref 0.0–40.0)

## 2019-05-05 LAB — COMPREHENSIVE METABOLIC PANEL
ALT: 18 U/L (ref 0–53)
AST: 19 U/L (ref 0–37)
Albumin: 4.2 g/dL (ref 3.5–5.2)
Alkaline Phosphatase: 68 U/L (ref 39–117)
BUN: 12 mg/dL (ref 6–23)
CO2: 27 mEq/L (ref 19–32)
Calcium: 9.4 mg/dL (ref 8.4–10.5)
Chloride: 105 mEq/L (ref 96–112)
Creatinine, Ser: 1.34 mg/dL (ref 0.40–1.50)
GFR: 62.96 mL/min (ref >60.00)
Glucose, Bld: 98 mg/dL (ref 70–99)
Potassium: 4.1 mEq/L (ref 3.5–5.1)
Sodium: 138 mEq/L (ref 135–145)
Total Bilirubin: 1.2 mg/dL (ref 0.2–1.2)
Total Protein: 7 g/dL (ref 6.0–8.3)

## 2019-05-06 ENCOUNTER — Encounter: Payer: Self-pay | Admitting: *Deleted

## 2019-11-12 DIAGNOSIS — Z20822 Contact with and (suspected) exposure to covid-19: Secondary | ICD-10-CM | POA: Diagnosis not present

## 2019-12-17 ENCOUNTER — Other Ambulatory Visit: Payer: Self-pay

## 2019-12-17 ENCOUNTER — Ambulatory Visit (INDEPENDENT_AMBULATORY_CARE_PROVIDER_SITE_OTHER): Payer: Medicare PPO

## 2019-12-17 DIAGNOSIS — Z23 Encounter for immunization: Secondary | ICD-10-CM

## 2020-03-05 ENCOUNTER — Telehealth: Payer: Self-pay | Admitting: Family Medicine

## 2020-03-05 DIAGNOSIS — E7800 Pure hypercholesterolemia, unspecified: Secondary | ICD-10-CM

## 2020-03-05 NOTE — Telephone Encounter (Signed)
-----   Message from Aquilla Solian, RT sent at 03/05/2020  1:44 PM EST ----- Regarding: Lab Orders for Friday 1.14.2022 Please place lab orders for Friday 1.14.2022, office visit for physical on Friday 1.21.2022 Thank you, Jones Bales RT(R)

## 2020-03-09 ENCOUNTER — Other Ambulatory Visit (INDEPENDENT_AMBULATORY_CARE_PROVIDER_SITE_OTHER): Payer: Medicare PPO

## 2020-03-09 ENCOUNTER — Other Ambulatory Visit: Payer: Self-pay

## 2020-03-09 DIAGNOSIS — E7800 Pure hypercholesterolemia, unspecified: Secondary | ICD-10-CM

## 2020-03-09 LAB — COMPREHENSIVE METABOLIC PANEL
ALT: 13 U/L (ref 0–53)
AST: 16 U/L (ref 0–37)
Albumin: 4.4 g/dL (ref 3.5–5.2)
Alkaline Phosphatase: 58 U/L (ref 39–117)
BUN: 14 mg/dL (ref 6–23)
CO2: 27 mEq/L (ref 19–32)
Calcium: 9.3 mg/dL (ref 8.4–10.5)
Chloride: 104 mEq/L (ref 96–112)
Creatinine, Ser: 1.29 mg/dL (ref 0.40–1.50)
GFR: 54.29 mL/min — ABNORMAL LOW (ref >60.00)
Glucose, Bld: 92 mg/dL (ref 70–99)
Potassium: 4.3 mEq/L (ref 3.5–5.1)
Sodium: 138 mEq/L (ref 135–145)
Total Bilirubin: 1.1 mg/dL (ref 0.2–1.2)
Total Protein: 7.1 g/dL (ref 6.0–8.3)

## 2020-03-09 LAB — LIPID PANEL
Cholesterol: 187 mg/dL (ref 0–200)
HDL: 36.3 mg/dL — ABNORMAL LOW (ref >39.00)
LDL Cholesterol: 134 mg/dL — ABNORMAL HIGH (ref 0–99)
NonHDL: 151.1
Total CHOL/HDL Ratio: 5
Triglycerides: 87 mg/dL (ref 0.0–149.0)
VLDL: 17.4 mg/dL (ref 0.0–40.0)

## 2020-03-16 ENCOUNTER — Ambulatory Visit (INDEPENDENT_AMBULATORY_CARE_PROVIDER_SITE_OTHER): Payer: Medicare PPO

## 2020-03-16 ENCOUNTER — Other Ambulatory Visit: Payer: Self-pay

## 2020-03-16 ENCOUNTER — Ambulatory Visit (INDEPENDENT_AMBULATORY_CARE_PROVIDER_SITE_OTHER): Payer: Medicare PPO | Admitting: Family Medicine

## 2020-03-16 ENCOUNTER — Encounter: Payer: Self-pay | Admitting: Family Medicine

## 2020-03-16 VITALS — BP 110/72 | HR 78 | Temp 98.1°F | Ht 71.5 in | Wt 230.5 lb

## 2020-03-16 DIAGNOSIS — Z0000 Encounter for general adult medical examination without abnormal findings: Secondary | ICD-10-CM

## 2020-03-16 DIAGNOSIS — E7800 Pure hypercholesterolemia, unspecified: Secondary | ICD-10-CM

## 2020-03-16 MED ORDER — SILDENAFIL CITRATE 20 MG PO TABS: ORAL_TABLET | ORAL | 0 refills | Status: DC

## 2020-03-16 MED ORDER — ATORVASTATIN CALCIUM 10 MG PO TABS: 5.0000 mg | ORAL_TABLET | Freq: Every day | ORAL | 3 refills | Status: DC

## 2020-03-16 NOTE — Patient Instructions (Signed)
Christopher [name] , Thank you for taking time to come for your Medicare Wellness Visit. I appreciate your ongoing commitment to your health goals. Please review the following plan we discussed and let me know if I can assist you in the future.   Screening recommendations/referrals: Colonoscopy: FOBT due, discuss with provider at physical  Recommended yearly ophthalmology/optometry visit for glaucoma screening and checkup Recommended yearly dental visit for hygiene and checkup  Vaccinations: Influenza vaccine: Up to date, completed 12/17/2019, due 09/2020 Pneumococcal vaccine: Completed series Tdap vaccine: Up to date, completed 07/29/2013, due 07/2023 Shingles vaccine: due, check with your insurance regarding coverage if interested.    Covid-19: Completed series  Advanced directives: Advance directive discussed with you today. Even though you declined this today please call our office should you change your mind and we can give you the proper paperwork for you to fill out.   Conditions/risks identified: hypercholesterolemia   Next appointment: Follow up in one year for your annual wellness visit.   Preventive Care 51 Years and Older, Male Preventive care refers to lifestyle choices and visits with your health care provider that can promote health and wellness. What does preventive care include?  A yearly physical exam. This is also called an annual well check.  Dental exams once or twice a year.  Routine eye exams. Ask your health care provider how often you should have your eyes checked.  Personal lifestyle choices, including:  Daily care of your teeth and gums.  Regular physical activity.  Eating a healthy diet.  Avoiding tobacco and drug use.  Limiting alcohol use.  Practicing safe sex.  Taking low doses of aspirin every day.  Taking vitamin and mineral supplements as recommended by your health care provider. What happens during an annual well check? The services and  screenings done by your health care provider during your annual well check will depend on your age, overall health, lifestyle risk factors, and family history of disease. Counseling  Your health care provider may ask you questions about your:  Alcohol use.  Tobacco use.  Drug use.  Emotional well-being.  Home and relationship well-being.  Sexual activity.  Eating habits.  History of falls.  Memory and ability to understand (cognition).  Work and work Astronomer. Screening  You may have the following tests or measurements:  Height, weight, and BMI.  Blood pressure.  Lipid and cholesterol levels. These may be checked every 5 years, or more frequently if you are over 23 years old.  Skin check.  Lung cancer screening. You may have this screening every year starting at age 1 if you have a 30-pack-year history of smoking and currently smoke or have quit within the past 15 years.  Fecal occult blood test (FOBT) of the stool. You may have this test every year starting at age 24.  Flexible sigmoidoscopy or colonoscopy. You may have a sigmoidoscopy every 5 years or a colonoscopy every 10 years starting at age 33.  Prostate cancer screening. Recommendations will vary depending on your family history and other risks.  Hepatitis C blood test.  Hepatitis B blood test.  Sexually transmitted disease (STD) testing.  Diabetes screening. This is done by checking your blood sugar (glucose) after you have not eaten for a while (fasting). You may have this done every 1-3 years.  Abdominal aortic aneurysm (AAA) screening. You may need this if you are a current or former smoker.  Osteoporosis. You may be screened starting at age 10 if you are at high  risk. Talk with your health care provider about your test results, treatment options, and if necessary, the need for more tests. Vaccines  Your health care provider may recommend certain vaccines, such as:  Influenza vaccine. This is  recommended every year.  Tetanus, diphtheria, and acellular pertussis (Tdap, Td) vaccine. You may need a Td booster every 10 years.  Zoster vaccine. You may need this after age 56.  Pneumococcal 13-valent conjugate (PCV13) vaccine. One dose is recommended after age 5.  Pneumococcal polysaccharide (PPSV23) vaccine. One dose is recommended after age 59. Talk to your health care provider about which screenings and vaccines you need and how often you need them. This information is not intended to replace advice given to you by your health care provider. Make sure you discuss any questions you have with your health care provider. Document Released: 03/09/2015 Document Revised: 10/31/2015 Document Reviewed: 12/12/2014 Elsevier Interactive Patient Education  2017 [place] Prevention in the Home Falls can cause injuries. They can happen to people of all ages. There are many things you can do to make your home safe and to help prevent falls. What can I do on the outside of my home?  Regularly fix the edges of walkways and driveways and fix any cracks.  Remove anything that might make you trip as you walk through a door, such as a raised step or threshold.  Trim any bushes or trees on the path to your home.  Use bright outdoor lighting.  Clear any walking paths of anything that might make someone trip, such as rocks or tools.  Regularly check to see if handrails are loose or broken. Make sure that both sides of any steps have handrails.  Any raised decks and porches should have guardrails on the edges.  Have any leaves, snow, or ice cleared regularly.  Use sand or salt on walking paths during winter.  Clean up any spills in your garage right away. This includes oil or grease spills. What can I do in the bathroom?  Use night lights.  Install grab bars by the toilet and in the tub and shower. Do not use towel bars as grab bars.  Use non-skid mats or decals in the tub or  shower.  If you need to sit down in the shower, use a plastic, non-slip stool.  Keep the floor dry. Clean up any water that spills on the floor as soon as it happens.  Remove soap buildup in the tub or shower regularly.  Attach bath mats securely with double-sided non-slip rug tape.  Do not have throw rugs and other things on the floor that can make you trip. What can I do in the bedroom?  Use night lights.  Make sure that you have a light by your bed that is easy to reach.  Do not use any sheets or blankets that are too big for your bed. They should not hang down onto the floor.  Have a firm chair that has side arms. You can use this for support while you get dressed.  Do not have throw rugs and other things on the floor that can make you trip. What can I do in the kitchen?  Clean up any spills right away.  Avoid walking on wet floors.  Keep items that you use a lot in easy-to-reach places.  If you need to reach something above you, use a strong step stool that has a grab bar.  Keep electrical cords out of the way.  Do not use floor polish or wax that makes floors slippery. If you must use wax, use non-skid floor wax.  Do not have throw rugs and other things on the floor that can make you trip. What can I do with my stairs?  Do not leave any items on the stairs.  Make sure that there are handrails on both sides of the stairs and use them. Fix handrails that are broken or loose. Make sure that handrails are as long as the stairways.  Check any carpeting to make sure that it is firmly attached to the stairs. Fix any carpet that is loose or worn.  Avoid having throw rugs at the top or bottom of the stairs. If you do have throw rugs, attach them to the floor with carpet tape.  Make sure that you have a light switch at the top of the stairs and the bottom of the stairs. If you do not have them, ask someone to add them for you. What else can I do to help prevent  falls?  Wear shoes that:  Do not have high heels.  Have rubber bottoms.  Are comfortable and fit you well.  Are closed at the toe. Do not wear sandals.  If you use a stepladder:  Make sure that it is fully opened. Do not climb a closed stepladder.  Make sure that both sides of the stepladder are locked into place.  Ask someone to hold it for you, if possible.  Clearly mark and make sure that you can see:  Any grab bars or handrails.  First and last steps.  Where the edge of each step is.  Use tools that help you move around (mobility aids) if they are needed. These include:  Canes.  Walkers.  Scooters.  Crutches.  Turn on the lights when you go into a dark area. Replace any light bulbs as soon as they burn out.  Set up your furniture so you have a clear path. Avoid moving your furniture around.  If any of your floors are uneven, fix them.  If there are any pets around you, be aware of where they are.  Review your medicines with your doctor. Some medicines can make you feel dizzy. This can increase your chance of falling. Ask your doctor what other things that you can do to help prevent falls. This information is not intended to replace advice given to you by your health care provider. Make sure you discuss any questions you have with your health care provider. Document Released: 12/07/2008 Document Revised: 07/19/2015 Document Reviewed: 03/17/2014 Elsevier Interactive Patient Education  2017 [employer].

## 2020-03-16 NOTE — Progress Notes (Signed)
PCP notes:  Health Maintenance: Shingrix- Patient wants to get this at physical if insurance covers it    FOBT due  Abnormal Screenings: none   Patient concerns: none   Nurse concerns: none   Next PCP appt.: 03/16/2020 @ 12 pm

## 2020-03-16 NOTE — Patient Instructions (Signed)
Restart atorvastatin 1/2 tablet daily.. call if not tolerated.  Can return in 3-6 moths to make sure cholesterol is improving.

## 2020-03-16 NOTE — Progress Notes (Signed)
Subjective:   Christopher [name] is a 75 y.o. male who presents for Medicare Annual/Subsequent preventive examination.  Review of Systems: N/A      I connected with the patient today by telephone and verified that I am speaking with the correct person using two identifiers. Location patient: home Location nurse: work Persons participating in the telephone visit: patient, nurse.   I discussed the limitations, risks, security and privacy concerns of performing an evaluation and management service by telephone and the availability of in person appointments. I also discussed with the patient that there may be a patient responsible charge related to this service. The patient expressed understanding and verbally consented to this telephonic visit.        Cardiac Risk Factors include: advanced age (>42men, >80 women);male gender;Other (see comment), Risk factor comments: hypercholesterolemia     Objective:    Today's Vitals   There is no height or weight on file to calculate BMI.  Advanced Directives 03/16/2020 01/24/2019 01/14/2018 01/13/2017 11/08/2015  Does Patient Have a Medical Advance Directive? No No No No Yes  Type of Advance Directive - - - - Midwife  Does patient want to make changes to medical advance directive? - - - - No - Patient declined  Copy of Healthcare Power of Attorney in Chart? - - - - No - copy requested  Would patient like information on creating a medical advance directive? No - Patient declined No - Patient declined No - Patient declined No - Patient declined -    Current Medications (verified) Outpatient Encounter Medications as of 03/16/2020  Medication Sig   aspirin EC 81 MG tablet Take 81 mg by mouth once as needed.   Doxylamine Succinate, Sleep, (SLEEP AID PO) Take 1 tablet by mouth at bedtime as needed.   atorvastatin (LIPITOR) 10 MG tablet Take 1 tablet (10 mg total) by mouth daily. (Patient not taking: Reported on 03/16/2020)    Multiple Vitamin (MULTIVITAMIN WITH MINERALS) TABS tablet Take 1 tablet by mouth daily. (Patient not taking: Reported on 03/16/2020)   No facility-administered encounter medications on file as of 03/16/2020.    Allergies (verified) Patient has no known allergies.   History: Past Medical History:  Diagnosis Date   History of chicken pox    History reviewed. No pertinent surgical history. Family History  Problem Relation Age of Onset   Diabetes Mother    Heart disease Father    Social History   Socioeconomic History   Marital status: Legally Separated    Spouse name: Not on file   Number of children: Not on file   Years of education: Not on file   Highest education level: Not on file  Occupational History   Not on file  Tobacco Use   Smoking status: Former Smoker    Packs/day: 1.00    Years: 16.00    Pack years: 16.00    Types: Cigarettes    Quit date: 06/29/1982    Years since quitting: 37.7   Smokeless tobacco: Never Used  Vaping Use   Vaping Use: Never used  Substance and Sexual Activity   Alcohol use: Yes    Alcohol/week: 3.0 standard drinks    Types: 3 Cans of beer per week   Drug use: No   Sexual activity: Yes  Other Topics Concern   Not on file  Social History Narrative   Divorced, Has 5 children.   Social Determinants of Health   Financial Resource Strain: Low Risk  Difficulty of Paying Living Expenses: Not hard at all  Food Insecurity: No Food Insecurity   Worried About Running Out of Food in the Last Year: Never true   Ran Out of Food in the Last Year: Never true  Transportation Needs: No Transportation Needs   Lack of Transportation (Medical): No   Lack of Transportation (Non-Medical): No  Physical Activity: Inactive   Days of Exercise per Week: 0 days   Minutes of Exercise per Session: 0 min  Stress: No Stress Concern Present   Feeling of Stress : Not at all  Social Connections: Not on file    Tobacco  Counseling Counseling given: Not Answered   Clinical Intake:  Pre-visit preparation completed: Yes  Pain : No/denies pain     Nutritional Risks: None Diabetes: No  How often do you need to have someone help you when you read instructions, pamphlets, or other written materials from your doctor or pharmacy?: 1 - Never What is the last grade level you completed in school?: 12th  Diabetic: No Nutrition Risk Assessment:  Has the patient had any N/V/D within the last 2 months?  No  Does the patient have any non-healing wounds?  No  Has the patient had any unintentional weight loss or weight gain?  No   Diabetes:  Is the patient diabetic?  No  If diabetic, was a CBG obtained today?  N/A Did the patient bring in their glucometer from home?  N/A How often do you monitor your CBG's? N/A.   Financial Strains and Diabetes Management:  Are you having any financial strains with the device, your supplies or your medication? N/A.  Does the patient want to be seen by Chronic Care Management for management of their diabetes?  N/A Would the patient like to be referred to a Nutritionist or for Diabetic Management?  N/A   Interpreter Needed?: No  Information entered by :: CJohnson, LPN   Activities of Daily Living In your present state of health, do you have any difficulty performing the following activities: 03/16/2020  Hearing? N  Vision? N  Difficulty concentrating or making decisions? N  Walking or climbing stairs? N  Dressing or bathing? N  Doing errands, shopping? N  Preparing Food and eating ? N  Using the Toilet? N  In the past six months, have you accidently leaked urine? N  Do you have problems with loss of bowel control? N  Managing your Medications? N  Managing your Finances? N  Housekeeping or managing your Housekeeping? N  Some recent data might be hidden    Patient Care Team: Excell Seltzer, MD as PCP - General (Family Medicine) Blair Promise, OD as  Consulting Physician (Optometry)  Indicate any recent Medical Services you may have received from other than [facility] providers in the past year (date may be approximate).     Assessment:   This is a routine wellness examination for Christopher [name].  Hearing/Vision screen  Hearing Screening   125Hz  250Hz  500Hz  1000Hz  2000Hz  3000Hz  4000Hz  6000Hz  8000Hz   Right ear:           Left ear:           Vision Screening Comments: Patient gets annual eye exams  Dietary issues and exercise activities discussed: Current Exercise Habits: The patient does not participate in regular exercise at present, Exercise limited by: None identified  Goals     Increase physical activity     Starting 01/14/2018, I will continue to do strength training for 10  minutes 3-4 days per week.      Patient Stated     01/24/2019, I will try to work on losing about 20 lbs.      Patient Stated     03/16/2020, I will maintain and continue medications as prescribed.       Depression Screen PHQ 2/9 Scores 03/16/2020 01/24/2019 01/14/2018 01/13/2017 11/08/2015  PHQ - 2 Score 0 0 0 0 0  PHQ- 9 Score 0 0 0 3 -    Fall Risk Fall Risk  03/16/2020 01/24/2019 01/14/2018 01/13/2017 11/08/2015  Falls in the past year? 0 0 0 No No  Number falls in past yr: 0 0 - - -  Injury with Fall? 0 0 - - -  Risk for fall due to : No Fall Risks - - - -  Follow up Falls evaluation completed;Falls prevention discussed Falls evaluation completed;Falls prevention discussed - - -    FALL RISK PREVENTION PERTAINING TO THE HOME:  Any stairs in or around the home? Yes  If so, are there any without handrails? No  Home free of loose throw rugs in walkways, pet beds, electrical cords, etc? Yes  Adequate lighting in your home to reduce risk of falls? Yes   ASSISTIVE DEVICES UTILIZED TO PREVENT FALLS:  Life alert? No  Use of a cane, walker or w/c? No  Grab bars in the bathroom? No  Shower chair or bench in shower? No  Elevated toilet seat or a  handicapped toilet? No   TIMED UP AND GO:  Was the test performed? N/A telephone visit .    Cognitive Function: MMSE - Mini Mental State Exam 03/16/2020 01/24/2019 01/14/2018 01/13/2017 11/08/2015  Orientation to time 5 5 5 5 5   Orientation to Place 5 5 5 5 5   Registration 3 3 3 3 3   Attention/ Calculation 5 5 0 0 0  Recall 3 3 3 3 3   Language- name 2 objects - - 0 0 0  Language- repeat 1 1 1 1 1   Language- follow 3 step command - - 3 3 3   Language- read & follow direction - - 0 0 0  Write a sentence - - 0 0 0  Copy design - - 0 0 0  Total score - - 20 20 20   Mini Cog  Mini-Cog screen was completed. Maximum score is 22. A value of 0 denotes this part of the MMSE was not completed or the patient failed this part of the Mini-Cog screening.       Immunizations Immunization History  Administered Date(s) Administered   Fluad Quad(high Dose 65+) 10/28/2018, 12/17/2019   Influenza,inj,Quad PF,6+ Mos 03/21/2016, 01/13/2017, 12/10/2017   PFIZER(Purple Top)SARS-COV-2 Vaccination 04/05/2019, 04/26/2019, 12/27/2019   Pneumococcal Conjugate-13 10/05/2014   Pneumococcal Polysaccharide-23 11/08/2015   Tdap 07/29/2013   Zoster 12/06/2012    TDAP status: Up to date  Flu Vaccine status: Up to date  Pneumococcal vaccine status: Up to date  Covid-19 vaccine status: Completed vaccines  Qualifies for Shingles Vaccine? Yes   Zostavax completed Yes   Shingrix Completed?: No.    Education has been provided regarding the importance of this vaccine. Patient has been advised to call insurance company to determine out of pocket expense if they have not yet received this vaccine. Advised may also receive vaccine at local pharmacy or Health Dept. Verbalized acceptance and understanding.  Screening Tests Health Maintenance  Topic Date Due   COLON CANCER SCREENING ANNUAL FOBT  06/22/2019   COLONOSCOPY (Pts 45-63yrs  Insurance coverage will need to be confirmed)  11/07/2025 (Originally  04/20/2017)   TETANUS/TDAP  07/30/2023   INFLUENZA VACCINE  Completed   COVID-19 Vaccine  Completed   Hepatitis C Screening  Completed   PNA vac Low Risk Adult  Completed    Health Maintenance  Health Maintenance Due  Topic Date Due   COLON CANCER SCREENING ANNUAL FOBT  06/22/2019    Colorectal cancer screening: FOBT due, will discuss with provider at physical today   Lung Cancer Screening: (Low Dose CT Chest recommended if Age 57-80 years, 30 pack-year currently smoking OR have quit w/in 15 years.) does not qualify.   Additional Screening:  Hepatitis C Screening: does qualify; Completed 11/08/2015  Vision Screening: Recommended annual ophthalmology exams for early detection of glaucoma and other disorders of the eye. Is the patient up to date with their annual eye exam?  Yes  Who is the provider or what is the name of the office [facility] which the patient attends [facility] If pt is not established with a provider, would they like to be referred to a provider to establish care? No .   Dental Screening: Recommended annual dental exams for proper oral hygiene  Community Resource Referral / Chronic Care Management: CRR required this visit?  No   CCM required this visit?  No      Plan:     I have personally reviewed and noted the following in the patients chart:    Medical and social history  Use of alcohol, tobacco or illicit drugs   Current medications and supplements  Functional ability and status  Nutritional status  Physical activity  Advanced directives  List of other physicians  Hospitalizations, surgeries, and ER visits in previous 12 months  Vitals  Screenings to include cognitive, depression, and falls  Referrals and appointments  In addition, I have reviewed and discussed with patient certain preventive protocols, quality metrics, and best practice recommendations. A written personalized care plan for preventive  services as well as general preventive health recommendations were provided to patient.   Due to this being a telephonic visit, the after visit summary with patients personalized plan was offered to patient via office or my-chart. Patient preferred to pick up at office at next visit or via mychart.   Janalyn ShyJohnson, Maikel Neisler, LPN   1/61/09601/21/2022

## 2020-03-16 NOTE — Progress Notes (Signed)
Patient ID: Christopher [name], male    DOB: 1946-02-07, 75 y.o.   MRN: [id number]  This visit was conducted in person.  BP 110/72   Pulse 78   Temp 98.1 F (36.7 C) (Temporal)   Ht 5' 11.5" (1.816 m)   Wt 230 lb 8 oz (104.6 kg)   SpO2 96%   BMI 31.70 kg/m    CC:  Chief Complaint  Patient presents with  . Annual Exam    Part 2    Subjective:   HPI: Christopher [name] is a 75 y.o. male presenting on 03/16/2020 for Annual Exam (Part 2)  The patient presents for annual medicare wellness, complete physical and review of chronic health problems. He/She also has the following acute concerns today:  The patient saw a LPN or RN for medicare wellness visit.  Prevention and wellness was reviewed in detail. Note reviewed and important notes copied below. Health Maintenance: Shingrix- Patient wants to get this at physical if insurance covers it Abnormal Screenings: none Patient concerns: None  03/16/20  Elevated Cholesterol: LDL not at goal on atorvastatin 10 mg daily.. previously control much better at last check 10 months ago. Stopped taking it because he feel change sex drive. Lab Results  Component Value Date   CHOL 187 03/09/2020   HDL 36.30 (L) 03/09/2020   LDLCALC 134 (H) 03/09/2020   TRIG 87.0 03/09/2020   CHOLHDL 5 03/09/2020  Using medications without problems:none Muscle aches: none Diet compliance: moderate Exercise: walking 3-4 days a week Other complaints: The 10-year ASCVD risk score Denman George DC Montez Hageman., et al., 2013) is: 11.5%   Values used to calculate the score:     Age: 57 years     Sex: Male     Is Non-Hispanic African American: Yes     Diabetic: No     Tobacco smoker: No     Systolic Blood Pressure: 110 mmHg     Is BP treated: No     HDL Cholesterol: 36.3 mg/dL     Total Cholesterol: 187 mg/dL  Slgiht drop in GFR to 54 from 62     Relevant past medical, surgical, family and social history reviewed and updated as indicated. Interim medical history  since our last visit reviewed. Allergies and medications reviewed and updated. Outpatient Medications Prior to Visit  Medication Sig Dispense Refill  . aspirin EC 81 MG tablet Take 81 mg by mouth once as needed.    Marland Kitchen atorvastatin (LIPITOR) 10 MG tablet Take 1 tablet (10 mg total) by mouth daily. (Patient not taking: No sig reported) 90 tablet 3  . Doxylamine Succinate, Sleep, (SLEEP AID PO) Take 1 tablet by mouth at bedtime as needed.    . Multiple Vitamin (MULTIVITAMIN WITH MINERALS) TABS tablet Take 1 tablet by mouth daily. (Patient not taking: No sig reported)     No facility-administered medications prior to visit.     Per HPI unless specifically indicated in ROS section below Review of Systems  Constitutional: Negative for fatigue and fever.  HENT: Negative for ear pain.   Eyes: Negative for pain.  Respiratory: Negative for cough and shortness of breath.   Cardiovascular: Negative for chest pain, palpitations and leg swelling.  Gastrointestinal: Negative for abdominal pain.  Genitourinary: Negative for dysuria.  Musculoskeletal: Negative for arthralgias.  Neurological: Negative for syncope, light-headedness and headaches.  Psychiatric/Behavioral: Negative for dysphoric mood.   Objective:  BP 110/72   Pulse 78   Temp 98.1 F (36.7 C) (Temporal)  Ht 5' 11.5" (1.816 m)   Wt 230 lb 8 oz (104.6 kg)   SpO2 96%   BMI 31.70 kg/m   Wt Readings from Last 3 Encounters:  03/16/20 230 lb 8 oz (104.6 kg)  02/04/19 234 lb 4 oz (106.3 kg)  01/14/18 233 lb 8 oz (105.9 kg)      Physical Exam Constitutional:      General: He is not in acute distress.    Appearance: Normal appearance. He is well-developed and well-nourished. He is not ill-appearing or toxic-appearing.  HENT:     Head: Normocephalic and atraumatic.     Right Ear: Hearing, tympanic membrane, ear canal and external ear normal.     Left Ear: Hearing, tympanic membrane, ear canal and external ear normal.     Nose: Nose  normal.     Mouth/Throat:     Mouth: Oropharynx is clear and moist and mucous membranes are normal.     Pharynx: Uvula midline.  Eyes:     General: Lids are normal. Lids are everted, no foreign bodies appreciated.     Extraocular Movements: EOM normal.     Conjunctiva/sclera: Conjunctivae normal.     Pupils: Pupils are equal, round, and reactive to light.  Neck:     Thyroid: No thyroid mass or thyromegaly.     Vascular: No carotid bruit.     Trachea: Trachea and phonation normal.  Cardiovascular:     Rate and Rhythm: Normal rate and regular rhythm.     Pulses: Normal pulses and intact distal pulses.     Heart sounds: S1 normal and S2 normal. No murmur heard. No gallop.   Pulmonary:     Breath sounds: Normal breath sounds. No wheezing, rhonchi or rales.  Abdominal:     General: Bowel sounds are normal.     Palpations: Abdomen is soft. There is no hepatosplenomegaly.     Tenderness: There is no abdominal tenderness. There is no CVA tenderness, guarding or rebound.     Hernia: No hernia is present.  Musculoskeletal:     Cervical back: Normal range of motion and neck supple.  Lymphadenopathy:     Cervical: No cervical adenopathy.  Skin:    General: Skin is warm, dry and intact.     Findings: No rash.  Neurological:     Mental Status: He is alert.     Cranial Nerves: No cranial nerve deficit.     Sensory: No sensory deficit.     Gait: Gait normal.     Deep Tendon Reflexes: Strength normal and reflexes are normal and symmetric.  Psychiatric:        Mood and Affect: Mood and affect normal.        Speech: Speech normal.        Behavior: Behavior normal.        Judgment: Judgment normal.       Results for orders placed or performed in visit on 03/09/20  Comprehensive metabolic panel  Result Value Ref Range   Sodium 138 135 - 145 mEq/L   Potassium 4.3 3.5 - 5.1 mEq/L   Chloride 104 96 - 112 mEq/L   CO2 27 19 - 32 mEq/L   Glucose, Bld 92 70 - 99 mg/dL   BUN 14 6 - 23 mg/dL    Creatinine, Ser 3.41 0.40 - 1.50 mg/dL   Total Bilirubin 1.1 0.2 - 1.2 mg/dL   Alkaline Phosphatase 58 39 - 117 U/L   AST 16 0 - 37 U/L  ALT 13 0 - 53 U/L   Total Protein 7.1 6.0 - 8.3 g/dL   Albumin 4.4 3.5 - 5.2 g/dL   GFR 62.69 (L) >48.54 mL/min   Calcium 9.3 8.4 - 10.5 mg/dL  Lipid panel  Result Value Ref Range   Cholesterol 187 0 - 200 mg/dL   Triglycerides 62.7 0.0 - 149.0 mg/dL   HDL 03.50 (L) >09.38 mg/dL   VLDL 18.2 0.0 - 99.3 mg/dL   LDL Cholesterol 716 (H) 0 - 99 mg/dL   Total CHOL/HDL Ratio 5    NonHDL 151.10     This visit occurred during the SARS-CoV-2 public health emergency.  Safety protocols were in place, including screening questions prior to the visit, additional usage of staff PPE, and extensive cleaning of exam room while observing appropriate contact time as indicated for disinfecting solutions.   COVID 19 screen:  No recent travel or known exposure to COVID19 The patient denies respiratory symptoms of COVID 19 at this time. The importance of social distancing was discussed today.   Assessment and Plan The patient's preventative maintenance and recommended screening tests for an annual wellness exam were reviewed in full today. Brought up to date unless services declined.  Counselled on the importance of diet, exercise, and its role in overall health and mortality. The patient's FH and SH was reviewed, including their home life, tobacco status, and drug and alcohol status.   Vaccines: uptodate tdap, PNA,flu and COVID x 3. Rx for shingrix given. Prostate:No prostate cancer in family. No indication for  PSA/ prostate exam given age. Hep C:Neg. Colon: 2009 polyps, repeat in 10 years. Dr. Bosie Clos.. Neg IFOB 05/2018.Marland Kitchen no further eval needed. Former smoker:Remotely, asymptomatic.    Problem List Items Addressed This Visit    Pure hypercholesterolemia    ? SE at higher dose  Restart atorvastatin  but at 1/2 tablet daily.. call if not tolerated.   Can return in 3-6 moths to make sure cholesterol is improving.       Relevant Medications   atorvastatin (LIPITOR) 10 MG tablet   sildenafil (REVATIO) 20 MG tablet   Routine general medical examination at a health care facility - Primary      Meds ordered this encounter  Medications  . atorvastatin (LIPITOR) 10 MG tablet    Sig: Take 0.5 tablets (5 mg total) by mouth daily.    Dispense:  45 tablet    Refill:  3  . sildenafil (REVATIO) 20 MG tablet    Sig: 3-5 tabs 30 min before sexual activity    Dispense:  50 tablet    Refill:  0     Kerby Nora, MD

## 2020-04-30 NOTE — Assessment & Plan Note (Signed)
?   SE at higher dose  Restart atorvastatin  but at 1/2 tablet daily.. call if not tolerated.  Can return in 3-6 moths to make sure cholesterol is improving.

## 2020-07-16 ENCOUNTER — Telehealth: Payer: Self-pay | Admitting: *Deleted

## 2020-07-16 NOTE — Telephone Encounter (Signed)
Patient called and informed that Dr. Ermalene Searing is requesting he double his water intake. He was also informed that if its not improving in the next few weeks that she will start back at baseline. Patient was informed if any symptoms were to arise like lightheadedness, chest pain, or shortness of breath that he needs to make an appointment to be seen. Patient stated he understood and would do so.

## 2020-07-16 NOTE — Telephone Encounter (Signed)
Upon review of his chart... he typically runs low 107-117/60-68 but the 97/57 is low for him. Have him increase water intake x 2,if not improving  back to baseline in next few weeks or if any lightheadedness, chest pain, shortness pf breath.. make an appt to be seen.

## 2020-07-16 NOTE — Telephone Encounter (Signed)
Patient's girlfriend Christopher [name] (on [place]) called stating that she has concerns about his blood pressure running so low all the time. Christopher [name] stated that his blood pressure yesterday was 97/57 and his bottom number never gets above 60. Christopher [name] stated that he does not complain of any symptoms. Christopher [name] stated that he does nap a lot during the day. Christopher [name] stated that he is in good health and does not take much medication. Christopher [name] wants to know if Dr. Ermalene Searing thinks that they should be concerned about this.

## 2021-04-06 ENCOUNTER — Other Ambulatory Visit: Payer: Self-pay | Admitting: Family Medicine

## 2021-04-17 ENCOUNTER — Telehealth: Payer: Self-pay | Admitting: Family Medicine

## 2021-04-17 DIAGNOSIS — E7800 Pure hypercholesterolemia, unspecified: Secondary | ICD-10-CM

## 2021-04-17 NOTE — Telephone Encounter (Signed)
-----   Message from Terri J Walsh sent at 04/12/2021  2:25 PM EST ----- °Regarding: Lab orders for Tuesday, 2.28.23 °Patient is scheduled for CPX labs, please order future labs, Thanks , Terri ° ° °

## 2021-04-22 NOTE — Progress Notes (Signed)
Subjective:   Christopher [name] is a 76 y.o. male who presents for Medicare Annual/Subsequent preventive examination.  I connected with [name] today by telephone and verified that I am speaking with the correct person using two identifiers. Location patient: home Location provider: work Persons participating in the virtual visit: patient, Engineer, civil (consulting).    I discussed the limitations, risks, security and privacy concerns of performing an evaluation and management service by telephone and the availability of in person appointments. I also discussed with the patient that there may be a patient responsible charge related to this service. The patient expressed understanding and verbally consented to this telephonic visit.    Interactive audio and video telecommunications were attempted between this provider and patient, however failed, due to patient having technical difficulties OR patient did not have access to video capability.  We continued and completed visit with audio only.  Some vital signs may be absent or patient reported.   Time Spent with patient on telephone encounter: 20 minutes  Review of Systems     Cardiac Risk Factors include: advanced [name] (>55men, >57 women)     Objective:    Today's Vitals   04/23/21 1354  Weight: 230 lb (104.3 kg)  Height: 5\' 11"  (1.803 m)   Body mass index is 32.08 kg/m.  Advanced Directives 04/23/2021 03/16/2020 01/24/2019 01/14/2018 01/13/2017 11/08/2015  Does Patient Have a Medical Advance Directive? No No No No No Yes  Type of Advance Directive - - - - - Midwife  Does patient want to make changes to medical advance directive? - - - - - No - Patient declined  Copy of Healthcare Power of Attorney in Chart? - - - - - No - copy requested  Would patient like information on creating a medical advance directive? Yes (MAU/Ambulatory/Procedural Areas - Information given) No - Patient declined No - Patient declined No - Patient  declined No - Patient declined -    Current Medications (verified) Outpatient Encounter Medications as of 04/23/2021  Medication Sig   aspirin EC 81 MG tablet Take 81 mg by mouth once as needed.   atorvastatin (LIPITOR) 10 MG tablet TAKE 1/2 TABLET BY MOUTH DAILY   sildenafil (REVATIO) 20 MG tablet 3-5 tabs 30 min before sexual activity   No facility-administered encounter medications on file as of 04/23/2021.    Allergies (verified) Patient has no known allergies.   History: Past Medical History:  Diagnosis Date   History of chicken pox    History reviewed. No pertinent surgical history. Family History  Problem Relation [name] of Onset   Diabetes Mother    Heart disease Father    Social History   Socioeconomic History   Marital status: Legally Separated    Spouse name: Not on file   Number of children: Not on file   Years of education: Not on file   Highest education level: Not on file  Occupational History   Not on file  Tobacco Use   Smoking status: Former    Packs/day: 1.00    Years: 16.00    Pack years: 16.00    Types: Cigarettes    Quit date: 06/29/1982    Years since quitting: 38.8   Smokeless tobacco: Never  Vaping Use   Vaping Use: Never used  Substance and Sexual Activity   Alcohol use: Yes    Alcohol/week: 3.0 standard drinks    Types: 3 Cans of beer per week    Comment: occasional   Drug  use: No   Sexual activity: Yes  Other Topics Concern   Not on file  Social History Narrative   Divorced, Has 5 children.   Social Determinants of Health   Financial Resource Strain: Low Risk    Difficulty of Paying Living Expenses: Not hard at all  Food Insecurity: No Food Insecurity   Worried About Programme researcher, broadcasting/film/videounning Out of Food in the Last Year: Never true   Ran Out of Food in the Last Year: Never true  Transportation Needs: No Transportation Needs   Lack of Transportation (Medical): No   Lack of Transportation (Non-Medical): No  Physical Activity: Inactive   Days of  Exercise per Week: 0 days   Minutes of Exercise per Session: 0 min  Stress: No Stress Concern Present   Feeling of Stress : Not at all  Social Connections: Socially Isolated   Frequency of Communication with Friends and Family: More than three times a week   Frequency of Social Gatherings with Friends and Family: Once a week   Attends Religious Services: Never   Database administratorActive Member of Clubs or Organizations: No   Attends Engineer, structuralClub or Organization Meetings: Never   Marital Status: Separated    Tobacco Counseling Counseling given: Not Answered   Clinical Intake:  Pre-visit preparation completed: Yes  Pain : No/denies pain     BMI - recorded: 32.08 Nutritional Status: BMI > 30  Obese Nutritional Risks: None Diabetes: No  How often do you need to have someone help you when you read instructions, pamphlets, or other written materials from your doctor or pharmacy?: 1 - Never  Diabetic? No  Interpreter Needed?: No  Information entered by :: Sharen Heckamina [name] LPN   Activities of Daily Living In your present state of health, do you have any difficulty performing the following activities: 04/23/2021  Hearing? N  Vision? N  Difficulty concentrating or making decisions? N  Walking or climbing stairs? N  Dressing or bathing? N  Doing errands, shopping? N  Preparing Food and eating ? N  Using the Toilet? N  In the past six months, have you accidently leaked urine? Y  Do you have problems with loss of bowel control? N  Managing your Medications? N  Managing your Finances? N  Housekeeping or managing your Housekeeping? N  Some recent data might be hidden    Patient Care Team: Excell SeltzerBedsole, Amy E, MD as PCP - General (Family Medicine) Blair PromiseBulakowski, Neill, OD as Consulting Physician (Optometry)  Indicate any recent Medical Services you may have received from other than [facility] providers in the past year (date may be approximate).     Assessment:   This is a routine wellness examination for  Christopher [name].  Hearing/Vision screen Hearing Screening - Comments:: No issues  Vision Screening - Comments:: Last exam 2 years ago, plans to make an appointment   Dietary issues and exercise activities discussed: Current Exercise Habits: The patient does not participate in regular exercise at present   Goals Addressed             This Visit's Progress    Patient Stated       Would like to drink more water, exercise and healthier       Depression Screen PHQ 2/9 Scores 04/23/2021 03/16/2020 01/24/2019 01/14/2018 01/13/2017 11/08/2015  PHQ - 2 Score 0 0 0 0 0 0  PHQ- 9 Score - 0 0 0 3 -    Fall Risk Fall Risk  04/23/2021 03/16/2020 01/24/2019 01/14/2018 01/13/2017  Falls in the past  year? 0 0 0 0 No  Number falls in past yr: 0 0 0 - -  Injury with Fall? 0 0 0 - -  Risk for fall due to : No Fall Risks No Fall Risks - - -  Follow up Falls prevention discussed Falls evaluation completed;Falls prevention discussed Falls evaluation completed;Falls prevention discussed - -    FALL RISK PREVENTION PERTAINING TO THE HOME:  Any stairs in or around the home? No  If so, are there any without handrails? No  Home free of loose throw rugs in walkways, pet beds, electrical cords, etc? Yes  Adequate lighting in your home to reduce risk of falls? Yes   ASSISTIVE DEVICES UTILIZED TO PREVENT FALLS:  Life alert? No  Use of a cane, walker or w/c? No  Grab bars in the bathroom? No  Shower chair or bench in shower? No  Elevated toilet seat or a handicapped toilet? No   TIMED UP AND GO:  Was the test performed? No .   Cognitive Function: Normal cognitive status assessed by this Nurse Health Advisor. No abnormalities found.   MMSE - Mini Mental State Exam 03/16/2020 01/24/2019 01/14/2018 01/13/2017 11/08/2015  Orientation to time 5 5 5 5 5   Orientation to Place 5 5 5 5 5   Registration 3 3 3 3 3   Attention/ Calculation 5 5 0 0 0  Recall 3 3 3 3 3   Language- name 2 objects - - 0 0 0  Language-  repeat 1 1 1 1 1   Language- follow 3 step command - - 3 3 3   Language- read & follow direction - - 0 0 0  Write a sentence - - 0 0 0  Copy design - - 0 0 0  Total score - - 20 20 20         Immunizations Immunization History  Administered Date(s) Administered   Fluad Quad(high Dose 65+) 10/28/2018, 12/17/2019   Influenza,inj,Quad PF,6+ Mos 03/21/2016, 01/13/2017, 12/10/2017   PFIZER(Purple Top)SARS-COV-2 Vaccination 04/05/2019, 04/26/2019, 12/27/2019   Pneumococcal Conjugate-13 10/05/2014   Pneumococcal Polysaccharide-23 11/08/2015   Tdap 07/29/2013   Zoster Recombinat (Shingrix) 04/18/2021   Zoster, Live 12/06/2012    TDAP status: Up to date  Flu Vaccine status: Up to date  Pneumococcal vaccine status: Up to date  Covid-19 vaccine status: Information provided on how to obtain vaccines.   Qualifies for Shingles Vaccine? Yes   Zostavax completed Yes   Shingrix Completed?: No.    Education has been provided regarding the importance of this vaccine. Patient has been advised to call insurance company to determine out of pocket expense if they have not yet received this vaccine. Advised may also receive vaccine at local pharmacy or Health Dept. Verbalized acceptance and understanding.  Screening Tests Health Maintenance  Topic Date Due   COVID-19 Vaccine (4 - Booster for Pfizer series) 02/21/2020   INFLUENZA VACCINE  09/24/2020   Zoster Vaccines- Shingrix (2 of 2) 06/13/2021   TETANUS/TDAP  07/30/2023   Pneumonia Vaccine 82+ Years old  Completed   Hepatitis C Screening  Completed   HPV VACCINES  Aged Out   COLONOSCOPY (Pts 45-34yrs Insurance coverage will need to be confirmed)  Discontinued    Health Maintenance  Health Maintenance Due  Topic Date Due   COVID-19 Vaccine (4 - Booster for Pfizer series) 02/21/2020   INFLUENZA VACCINE  09/24/2020    Colorectal cancer screening: No longer required.   Lung Cancer Screening: (Low Dose CT Chest recommended if [name] 52-80  years, 30 pack-year currently smoking OR have quit w/in 15years.) does not qualify.    Additional Screening:  Hepatitis C Screening: does qualify; Completed 11/08/15  Vision Screening: Recommended annual ophthalmology exams for early detection of glaucoma and other disorders of the eye. Is the patient up to date with their annual eye exam?  No , patient plans to make an appointment Who is the provider or what is the name of the office in which the patient attends annual eye exams? Provider information unavailable    Dental Screening: Recommended annual dental exams for proper oral hygiene  Community Resource Referral / Chronic Care Management: CRR required this visit?  No   CCM required this visit?  No      Plan:     I have personally reviewed and noted the following in the patients chart:   Medical and social history Use of alcohol, tobacco or illicit drugs  Current medications and supplements including opioid prescriptions. Patient is not currently taking opioid prescriptions. Functional ability and status Nutritional status Physical activity Advanced directives List of other physicians Hospitalizations, surgeries, and ER visits in previous 12 months Vitals Screenings to include cognitive, depression, and falls Referrals and appointments  In addition, I have reviewed and discussed with patient certain preventive protocols, quality metrics, and best practice recommendations. A written personalized care plan for preventive services as well as general preventive health recommendations were provided to patient.   Due to this being a telephonic visit, the after visit summary with patients personalized plan was offered to patient via mail or my-chart.  Patient preferred to pick up at office at next visit.    Janne Lab, LPN   2/48/2500   Nurse Health Advisor  Nurse Notes: Patient plans on providing updated flu vaccine information

## 2021-04-23 ENCOUNTER — Other Ambulatory Visit (INDEPENDENT_AMBULATORY_CARE_PROVIDER_SITE_OTHER): Payer: Medicare PPO

## 2021-04-23 ENCOUNTER — Other Ambulatory Visit: Payer: Self-pay

## 2021-04-23 ENCOUNTER — Ambulatory Visit (INDEPENDENT_AMBULATORY_CARE_PROVIDER_SITE_OTHER): Payer: Medicare PPO

## 2021-04-23 VITALS — Ht 71.0 in | Wt 230.0 lb

## 2021-04-23 DIAGNOSIS — E7800 Pure hypercholesterolemia, unspecified: Secondary | ICD-10-CM

## 2021-04-23 DIAGNOSIS — Z0000 Encounter for general adult medical examination without abnormal findings: Secondary | ICD-10-CM | POA: Diagnosis not present

## 2021-04-23 LAB — COMPREHENSIVE METABOLIC PANEL
ALT: 16 U/L (ref 0–53)
AST: 16 U/L (ref 0–37)
Albumin: 4.4 g/dL (ref 3.5–5.2)
Alkaline Phosphatase: 54 U/L (ref 39–117)
BUN: 14 mg/dL (ref 6–23)
CO2: 25 mEq/L (ref 19–32)
Calcium: 9.1 mg/dL (ref 8.4–10.5)
Chloride: 106 mEq/L (ref 96–112)
Creatinine, Ser: 1.28 mg/dL (ref 0.40–1.50)
GFR: 54.37 mL/min — ABNORMAL LOW (ref >60.00)
Glucose, Bld: 96 mg/dL (ref 70–99)
Potassium: 3.9 mEq/L (ref 3.5–5.1)
Sodium: 139 mEq/L (ref 135–145)
Total Bilirubin: 1.1 mg/dL (ref 0.2–1.2)
Total Protein: 7.3 g/dL (ref 6.0–8.3)

## 2021-04-23 LAB — LIPID PANEL
Cholesterol: 141 mg/dL (ref 0–200)
HDL: 35.7 mg/dL — ABNORMAL LOW (ref >39.00)
LDL Cholesterol: 91 mg/dL (ref 0–99)
NonHDL: 105.29
Total CHOL/HDL Ratio: 4
Triglycerides: 72 mg/dL (ref 0.0–149.0)
VLDL: 14.4 mg/dL (ref 0.0–40.0)

## 2021-04-23 NOTE — Progress Notes (Signed)
No critical labs need to be addressed urgently. We will discuss labs in detail at upcoming office visit.   

## 2021-04-23 NOTE — Patient Instructions (Addendum)
[name] , Thank you for taking time to complete for your Medicare Wellness Visit. I appreciate your ongoing commitment to your health goals. Please review the following plan we discussed and let me know if I can assist you in the future.   Screening recommendations/referrals: Colonoscopy: no longer required  Recommended yearly ophthalmology/optometry visit for glaucoma screening and checkup Recommended yearly dental visit for hygiene and checkup  Vaccinations: Influenza vaccine: up to date, pleas provide vaccine information when available  Pneumococcal vaccine: up to date  Tdap vaccine: up to date, completed 07/29/13, due 07/30/23 Shingles vaccine: 1 dose completed 04/18/21   Covid-19: newest booster available at your local pharmacy  Advanced directives: Please bring a copy of Living Will and/or Healthcare Power of Attorney for your chart, when available    Conditions/risks identified: see problem list   Next appointment: Follow up in one year for your annual wellness visit. 04/24/22 @ 10:30am, this will be a telephone visit   Preventive Care 1 Years and Older, Male Preventive care refers to lifestyle choices and visits with your health care provider that can promote health and wellness. What does preventive care include? A yearly physical exam. This is also called an annual well check. Dental exams once or twice a year. Routine eye exams. Ask your health care provider how often you should have your eyes checked. Personal lifestyle choices, including: Daily care of your teeth and gums. Regular physical activity. Eating a healthy diet. Avoiding tobacco and drug use. Limiting alcohol use. Practicing safe sex. Taking low doses of aspirin every day. Taking vitamin and mineral supplements as recommended by your health care provider. What happens during an annual well check? The services and screenings done by your health care provider during your annual well check will depend on  your age, overall health, lifestyle risk factors, and family history of disease. Counseling  Your health care provider may ask you questions about your: Alcohol use. Tobacco use. Drug use. Emotional well-being. Home and relationship well-being. Sexual activity. Eating habits. History of falls. Memory and ability to understand (cognition). Work and work Astronomer. Screening  You may have the following tests or measurements: Height, weight, and BMI. Blood pressure. Lipid and cholesterol levels. These may be checked every 5 years, or more frequently if you are over 21 years old. Skin check. Lung cancer screening. You may have this screening every year starting at age 45 if you have a 30-pack-year history of smoking and currently smoke or have quit within the past 15 years. Fecal occult blood test (FOBT) of the stool. You may have this test every year starting at age 5. Flexible sigmoidoscopy or colonoscopy. You may have a sigmoidoscopy every 5 years or a colonoscopy every 10 years starting at age 59. Prostate cancer screening. Recommendations will vary depending on your family history and other risks. Hepatitis C blood test. Hepatitis B blood test. Sexually transmitted disease (STD) testing. Diabetes screening. This is done by checking your blood sugar (glucose) after you have not eaten for a while (fasting). You may have this done every 1-3 years. Abdominal aortic aneurysm (AAA) screening. You may need this if you are a current or former smoker. Osteoporosis. You may be screened starting at age 105 if you are at high risk. Talk with your health care provider about your test results, treatment options, and if necessary, the need for more tests. Vaccines  Your health care provider may recommend certain vaccines, such as: Influenza vaccine. This is recommended every year. Tetanus,  diphtheria, and acellular pertussis (Tdap, Td) vaccine. You may need a Td booster every 10 years. Zoster  vaccine. You may need this after age 27. Pneumococcal 13-valent conjugate (PCV13) vaccine. One dose is recommended after age 71. Pneumococcal polysaccharide (PPSV23) vaccine. One dose is recommended after age 8. Talk to your health care provider about which screenings and vaccines you need and how often you need them. This information is not intended to replace advice given to you by your health care provider. Make sure you discuss any questions you have with your health care provider. Document Released: 03/09/2015 Document Revised: 10/31/2015 Document Reviewed: 12/12/2014 Elsevier Interactive Patient Education  2017 [place] Prevention in the Home Falls can cause injuries. They can happen to people of all ages. There are many things you can do to make your home safe and to help prevent falls. What can I do on the outside of my home? Regularly fix the edges of walkways and driveways and fix any cracks. Remove anything that might make you trip as you walk through a door, such as a raised step or threshold. Trim any bushes or trees on the path to your home. Use bright outdoor lighting. Clear any walking paths of anything that might make someone trip, such as rocks or tools. Regularly check to see if handrails are loose or broken. Make sure that both sides of any steps have handrails. Any raised decks and porches should have guardrails on the edges. Have any leaves, snow, or ice cleared regularly. Use sand or salt on walking paths during winter. Clean up any spills in your garage right away. This includes oil or grease spills. What can I do in the bathroom? Use night lights. Install grab bars by the toilet and in the tub and shower. Do not use towel bars as grab bars. Use non-skid mats or decals in the tub or shower. If you need to sit down in the shower, use a plastic, non-slip stool. Keep the floor dry. Clean up any water that spills on the floor as soon as it happens. Remove  soap buildup in the tub or shower regularly. Attach bath mats securely with double-sided non-slip rug tape. Do not have throw rugs and other things on the floor that can make you trip. What can I do in the bedroom? Use night lights. Make sure that you have a light by your bed that is easy to reach. Do not use any sheets or blankets that are too big for your bed. They should not hang down onto the floor. Have a firm chair that has side arms. You can use this for support while you get dressed. Do not have throw rugs and other things on the floor that can make you trip. What can I do in the kitchen? Clean up any spills right away. Avoid walking on wet floors. Keep items that you use a lot in easy-to-reach places. If you need to reach something above you, use a strong step stool that has a grab bar. Keep electrical cords out of the way. Do not use floor polish or wax that makes floors slippery. If you must use wax, use non-skid floor wax. Do not have throw rugs and other things on the floor that can make you trip. What can I do with my stairs? Do not leave any items on the stairs. Make sure that there are handrails on both sides of the stairs and use them. Fix handrails that are broken or loose. Make  sure that handrails are as long as the stairways. Check any carpeting to make sure that it is firmly attached to the stairs. Fix any carpet that is loose or worn. Avoid having throw rugs at the top or bottom of the stairs. If you do have throw rugs, attach them to the floor with carpet tape. Make sure that you have a light switch at the top of the stairs and the bottom of the stairs. If you do not have them, ask someone to add them for you. What else can I do to help prevent falls? Wear shoes that: Do not have high heels. Have rubber bottoms. Are comfortable and fit you well. Are closed at the toe. Do not wear sandals. If you use a stepladder: Make sure that it is fully opened. Do not climb a  closed stepladder. Make sure that both sides of the stepladder are locked into place. Ask someone to hold it for you, if possible. Clearly mark and make sure that you can see: Any grab bars or handrails. First and last steps. Where the edge of each step is. Use tools that help you move around (mobility aids) if they are needed. These include: Canes. Walkers. Scooters. Crutches. Turn on the lights when you go into a dark area. Replace any light bulbs as soon as they burn out. Set up your furniture so you have a clear path. Avoid moving your furniture around. If any of your floors are uneven, fix them. If there are any pets around you, be aware of where they are. Review your medicines with your doctor. Some medicines can make you feel dizzy. This can increase your chance of falling. Ask your doctor what other things that you can do to help prevent falls. This information is not intended to replace advice given to you by your health care provider. Make sure you discuss any questions you have with your health care provider. Document Released: 12/07/2008 Document Revised: 07/19/2015 Document Reviewed: 03/17/2014 Elsevier Interactive Patient Education  2017 [employer].

## 2021-04-30 ENCOUNTER — Ambulatory Visit (INDEPENDENT_AMBULATORY_CARE_PROVIDER_SITE_OTHER): Payer: Medicare PPO | Admitting: Family Medicine

## 2021-04-30 ENCOUNTER — Other Ambulatory Visit: Payer: Self-pay

## 2021-04-30 ENCOUNTER — Encounter: Payer: Self-pay | Admitting: Family Medicine

## 2021-04-30 VITALS — BP 102/72 | HR 86 | Temp 98.4°F | Ht 71.25 in | Wt 225.4 lb

## 2021-04-30 DIAGNOSIS — Z0000 Encounter for general adult medical examination without abnormal findings: Secondary | ICD-10-CM | POA: Diagnosis not present

## 2021-04-30 DIAGNOSIS — E7800 Pure hypercholesterolemia, unspecified: Secondary | ICD-10-CM | POA: Diagnosis not present

## 2021-04-30 DIAGNOSIS — N528 Other male erectile dysfunction: Secondary | ICD-10-CM

## 2021-04-30 DIAGNOSIS — N529 Male erectile dysfunction, unspecified: Secondary | ICD-10-CM | POA: Insufficient documentation

## 2021-04-30 MED ORDER — SILDENAFIL CITRATE 100 MG PO TABS: 100.0000 mg | ORAL_TABLET | Freq: Every day | ORAL | 0 refills | Status: DC | PRN

## 2021-04-30 NOTE — Assessment & Plan Note (Signed)
Stable, chronic.  Continue current medication. ? ? ?We will refill sildenafil but he would prefer to have 100 mg tablets as opposed to multiple low-dose tablets for convenience. ?

## 2021-04-30 NOTE — Patient Instructions (Signed)
Keep up healthy eating and regular exercise. 

## 2021-04-30 NOTE — Assessment & Plan Note (Signed)
Stable, chronic.  Continue current medication.   Atorvastatin  10 mg 1/2 tablet daily 

## 2021-04-30 NOTE — Progress Notes (Signed)
? ? Patient ID: Christopher [name], male    DOB: 01-28-1946, 76 y.o.   MRN: [id number] ? ?This visit was conducted in person. ? ?BP 102/72   Pulse 86   Temp 98.4 ?F (36.9 ?C) (Oral)   Ht 5' 11.25" (1.81 m)   Wt 225 lb 7 oz (102.3 kg)   SpO2 95%   BMI 31.22 kg/m?   ? ?CC:  ?Chief Complaint  ?Patient presents with  ? Annual Exam  ?  Part 2  ? ? ?Subjective:  ? ?HPI: ?Christopher [name] is a 76 y.o. male presenting on 04/30/2021 for Annual Exam (Part 2) ? ?The patient presents for  complete physical and review of chronic health problems. He/She also has the following acute concerns today: none ? ?The patient saw a LPN or RN for medicare wellness visit. ? Prevention and wellness was reviewed in detail. ?Note reviewed and important notes copied below. ? ?BP Readings from Last 3 Encounters:  ?04/30/21 102/72  ?03/16/20 110/72  ?02/04/19 100/68  ? ? ? ?Elevated Cholesterol:  LDL improved back on atorvastatin 10 mg daily  ?Lab Results  ?Component Value Date  ? CHOL 141 04/23/2021  ? HDL 35.70 (L) 04/23/2021  ? LDLCALC 91 04/23/2021  ? TRIG 72.0 04/23/2021  ? CHOLHDL 4 04/23/2021  ?Using medications without problems: ?The 10-year ASCVD risk score (Arnett DK, et al., 2019) is: 9.7% ?  Values used to calculate the score: ?    Age: 23 years ?    Sex: Male ?    Is Non-Hispanic African American: Yes ?    Diabetic: No ?    Tobacco smoker: No ?    Systolic Blood Pressure: 102 mmHg ?    Is BP treated: No ?    HDL Cholesterol: 35.7 mg/dL ?    Total Cholesterol: 141 mg/dL ?Muscle aches:  ?Diet compliance: fruits and veggies, water, lean protein ?Exercise: working  in yard, walking  ?Other complaints: ? ?Wt Readings from Last 3 Encounters:  ?04/30/21 225 lb 7 oz (102.3 kg)  ?04/23/21 230 lb (104.3 kg)  ?03/16/20 230 lb 8 oz (104.6 kg)  ?Body mass index is 31.22 kg/m?. ? ?   ? ?Relevant past medical, surgical, family and social history reviewed and updated as indicated. Interim medical history since our last visit reviewed. ?Allergies and  medications reviewed and updated. ?Outpatient Medications Prior to Visit  ?Medication Sig Dispense Refill  ? aspirin EC 81 MG tablet Take 81 mg by mouth once as needed.    ? atorvastatin (LIPITOR) 10 MG tablet TAKE 1/2 TABLET BY MOUTH DAILY 45 tablet 0  ? sildenafil (REVATIO) 20 MG tablet 3-5 tabs 30 min before sexual activity 50 tablet 0  ? ?No facility-administered medications prior to visit.  ?  ? ?Per HPI unless specifically indicated in ROS section below ?Review of Systems  ?Constitutional:  Negative for fatigue and fever.  ?HENT:  Negative for ear pain.   ?Eyes:  Negative for pain.  ?Respiratory:  Negative for cough and shortness of breath.   ?Cardiovascular:  Negative for chest pain, palpitations and leg swelling.  ?Gastrointestinal:  Negative for abdominal pain.  ?Genitourinary:  Negative for dysuria.  ?Musculoskeletal:  Negative for arthralgias.  ?Neurological:  Negative for syncope, light-headedness and headaches.  ?Psychiatric/Behavioral:  Negative for dysphoric mood.   ?Objective:  ?BP 102/72   Pulse 86   Temp 98.4 ?F (36.9 ?C) (Oral)   Ht 5' 11.25" (1.81 m)   Wt 225 lb 7 oz (102.3 kg)  SpO2 95%   BMI 31.22 kg/m?   ?Wt Readings from Last 3 Encounters:  ?04/30/21 225 lb 7 oz (102.3 kg)  ?04/23/21 230 lb (104.3 kg)  ?03/16/20 230 lb 8 oz (104.6 kg)  ?  ?  ?Physical Exam ?Constitutional:   ?   General: He is not in acute distress. ?   Appearance: Normal appearance. He is well-developed. He is not ill-appearing or toxic-appearing.  ?HENT:  ?   Head: Normocephalic and atraumatic.  ?   Right Ear: Hearing, tympanic membrane, ear canal and external ear normal.  ?   Left Ear: Hearing, tympanic membrane, ear canal and external ear normal.  ?   Nose: Nose normal.  ?   Mouth/Throat:  ?   Pharynx: Uvula midline.  ?Eyes:  ?   General: Lids are normal. Lids are everted, no foreign bodies appreciated.  ?   Conjunctiva/sclera: Conjunctivae normal.  ?   Pupils: Pupils are equal, round, and reactive to light.   ?Neck:  ?   Thyroid: No thyroid mass or thyromegaly.  ?   Vascular: No carotid bruit.  ?   Trachea: Trachea and phonation normal.  ?Cardiovascular:  ?   Rate and Rhythm: Normal rate and regular rhythm.  ?   Pulses: Normal pulses.  ?   Heart sounds: S1 normal and S2 normal. No murmur heard. ?  No gallop.  ?Pulmonary:  ?   Breath sounds: Normal breath sounds. No wheezing, rhonchi or rales.  ?Abdominal:  ?   General: Bowel sounds are normal.  ?   Palpations: Abdomen is soft.  ?   Tenderness: There is no abdominal tenderness. There is no guarding or rebound.  ?   Hernia: No hernia is present.  ?Musculoskeletal:  ?   Cervical back: Normal range of motion and neck supple.  ?Lymphadenopathy:  ?   Cervical: No cervical adenopathy.  ?Skin: ?   General: Skin is warm and dry.  ?   Findings: No rash.  ?Neurological:  ?   Mental Status: He is alert.  ?   Cranial Nerves: No cranial nerve deficit.  ?   Sensory: No sensory deficit.  ?   Gait: Gait normal.  ?   Deep Tendon Reflexes: Reflexes are normal and symmetric.  ?Psychiatric:     ?   Speech: Speech normal.     ?   Behavior: Behavior normal.     ?   Judgment: Judgment normal.  ? ?   ?Results for orders placed or performed in visit on 04/23/21  ?Comprehensive metabolic panel  ?Result Value Ref Range  ? Sodium 139 135 - 145 mEq/L  ? Potassium 3.9 3.5 - 5.1 mEq/L  ? Chloride 106 96 - 112 mEq/L  ? CO2 25 19 - 32 mEq/L  ? Glucose, Bld 96 70 - 99 mg/dL  ? BUN 14 6 - 23 mg/dL  ? Creatinine, Ser 1.28 0.40 - 1.50 mg/dL  ? Total Bilirubin 1.1 0.2 - 1.2 mg/dL  ? Alkaline Phosphatase 54 39 - 117 U/L  ? AST 16 0 - 37 U/L  ? ALT 16 0 - 53 U/L  ? Total Protein 7.3 6.0 - 8.3 g/dL  ? Albumin 4.4 3.5 - 5.2 g/dL  ? GFR 54.37 (L) >60.00 mL/min  ? Calcium 9.1 8.4 - 10.5 mg/dL  ?Lipid panel  ?Result Value Ref Range  ? Cholesterol 141 0 - 200 mg/dL  ? Triglycerides 72.0 0.0 - 149.0 mg/dL  ? HDL 35.70 (L) >39.00 mg/dL  ?  VLDL 14.4 0.0 - 40.0 mg/dL  ? LDL Cholesterol 91 0 - 99 mg/dL  ? Total CHOL/HDL  Ratio 4   ? NonHDL 105.29   ? ? ?This visit occurred during the SARS-CoV-2 public health emergency.  Safety protocols were in place, including screening questions prior to the visit, additional usage of staff PPE, and extensive cleaning of exam room while observing appropriate contact time as indicated for disinfecting solutions.  ? ?COVID 19 screen:  No recent travel or known exposure to COVID19 ?The patient denies respiratory symptoms of COVID 19 at this time. ?The importance of social distancing was discussed today.  ? ?Assessment and Plan ?The patient's preventative maintenance and recommended screening tests for an annual wellness exam were reviewed in full today. ?Brought up to date unless services declined. ? ?Counselled on the importance of diet, exercise, and its role in overall health and mortality. ?The patient's FH and SH was reviewed, including their home life, tobacco status, and drug and alcohol status.  ? ?Vaccines: uptodate tdap, PNA, flu,COVID, Shingrix x 1 ?Prostate: No prostate cancer in family. No indication for  PSA/ prostate exam given age. ?Hep C: Neg. ?Colon: 2009 polyps, repeat in 10 years. Dr. Bosie Clos.. Neg IFOB  05/2018.Marland Kitchen no further eval needed. ?Former smoker:  Remotely, asymptomatic. ?  ?Problem List Items Addressed This Visit   ? ? ED (erectile dysfunction)  ?  Stable, chronic.  Continue current medication. ? ? ?We will refill sildenafil but he would prefer to have 100 mg tablets as opposed to multiple low-dose tablets for convenience. ?  ?  ? Pure hypercholesterolemia  ?  Stable, chronic.  Continue current medication. ? ? ?Atorvastatin  10 mg 1/2 tablet daily ?  ?  ? Relevant Medications  ? sildenafil (VIAGRA) 100 MG tablet  ? Routine general medical examination at a health care facility - Primary  ? ? ?Kerby Nora, MD  ? ?

## 2021-07-07 ENCOUNTER — Other Ambulatory Visit: Payer: Self-pay | Admitting: Family Medicine

## 2021-07-17 ENCOUNTER — Telehealth: Payer: Self-pay | Admitting: Family Medicine

## 2021-07-17 MED ORDER — SILDENAFIL CITRATE 100 MG PO TABS: 100.0000 mg | ORAL_TABLET | Freq: Every day | ORAL | 2 refills | Status: DC | PRN

## 2021-07-17 NOTE — Telephone Encounter (Signed)
Encourage patient to contact the pharmacy for refills or they can request refills through [facility]  Did the patient contact the pharmacy:  No  LAST APPOINTMENT DATE:  04/30/2021  MEDICATION:  sildenafil (VIAGRA) 100 MG tablet  Is the patient out of medication? Not yet  If not, how much is left? 1-2 left  Is this a 90 day supply: No  PHARMACY:  [id number] [place], [place] - [address] Phone Number: [phone]  Let patient know to contact pharmacy at the end of the day to make sure medication is ready.  Please notify patient to allow 48-72 hours to process

## 2021-07-17 NOTE — Telephone Encounter (Signed)
Refill sent as requested. 

## 2021-12-13 ENCOUNTER — Ambulatory Visit (INDEPENDENT_AMBULATORY_CARE_PROVIDER_SITE_OTHER): Payer: Medicare PPO

## 2021-12-13 DIAGNOSIS — Z23 Encounter for immunization: Secondary | ICD-10-CM | POA: Diagnosis not present

## 2022-03-31 ENCOUNTER — Telehealth: Payer: Self-pay | Admitting: Family Medicine

## 2022-03-31 MED ORDER — SILDENAFIL CITRATE 100 MG PO TABS: 100.0000 mg | ORAL_TABLET | Freq: Every day | ORAL | 0 refills | Status: DC | PRN

## 2022-03-31 MED ORDER — ATORVASTATIN CALCIUM 10 MG PO TABS: 5.0000 mg | ORAL_TABLET | Freq: Every day | ORAL | 0 refills | Status: DC

## 2022-03-31 NOTE — Telephone Encounter (Signed)
Prescription Request  03/31/2022  Is this a "Controlled Substance" medicine? No  LOV: 04/30/2021  What is the name of the medication or equipment? sildenafil (VIAGRA) 100 MG tablet   atorvastatin (LIPITOR) 10 MG tablet   Have you contacted your pharmacy to request a refill? Yes   Which pharmacy would you like this sent to?  [place] ([place]), [place] - [address] [place] [address] Phone: [phone] Fax: [phone]    Patient notified that their request is being sent to the clinical staff for review and that they should receive a response within 2 business days.   Please advise at [facility] [phone]

## 2022-03-31 NOTE — Addendum Note (Signed)
Addended by: Carter Kitten on: 03/31/2022 12:05 PM   Modules accepted: Orders

## 2022-03-31 NOTE — Telephone Encounter (Signed)
Refills sent as requested

## 2022-04-24 ENCOUNTER — Ambulatory Visit (INDEPENDENT_AMBULATORY_CARE_PROVIDER_SITE_OTHER): Payer: Medicare PPO

## 2022-04-24 VITALS — Ht 71.75 in | Wt 230.0 lb

## 2022-04-24 DIAGNOSIS — Z0000 Encounter for general adult medical examination without abnormal findings: Secondary | ICD-10-CM

## 2022-04-24 NOTE — Patient Instructions (Signed)
Christopher [name] , Thank you for taking time to come for your Medicare Wellness Visit. I appreciate your ongoing commitment to your health goals. Please review the following plan we discussed and let me know if I can assist you in the future.   These are the goals we discussed:  Goals      Increase physical activity     Starting 01/14/2018, I will continue to do strength training for 10 minutes 3-4 days per week.      Patient Stated     01/24/2019, I will try to work on losing about 20 lbs.      Patient Stated     03/16/2020, I will maintain and continue medications as prescribed.      Patient Stated     Would like to drink more water, exercise and healthier     Patient Stated     Lose about 20lb.        This is a list of the screening recommended for you and due dates:  Health Maintenance  Topic Date Due   Medicare Annual Wellness Visit  04/24/2023   DTaP/Tdap/Td vaccine (2 - Td or Tdap) 07/30/2023   Pneumonia Vaccine  Completed   Flu Shot  Completed   COVID-19 Vaccine  Completed   Hepatitis C Screening: USPSTF Recommendation to screen - Ages 28-79 yo.  Completed   Zoster (Shingles) Vaccine  Completed   HPV Vaccine  Aged Out   Colon Cancer Screening  Discontinued    Advanced directives: Please bring a copy of your health care power of attorney and living will to the office to be added to your chart at your convenience.   Conditions/risks identified: Aim for 30 minutes of exercise or brisk walking, 6-8 glasses of water, and 5 servings of fruits and vegetables each day.   Next appointment: Follow up in one year for your annual wellness visit. 04/30/2023 @ 9:45 telephone visit.  Preventive Care 22 Years and Older, Male  Preventive care refers to lifestyle choices and visits with your health care provider that can promote health and wellness. What does preventive care include? A yearly physical exam. This is also called an annual well check. Dental exams once or twice a  year. Routine eye exams. Ask your health care provider how often you should have your eyes checked. Personal lifestyle choices, including: Daily care of your teeth and gums. Regular physical activity. Eating a healthy diet. Avoiding tobacco and drug use. Limiting alcohol use. Practicing safe sex. Taking low doses of aspirin every day. Taking vitamin and mineral supplements as recommended by your health care provider. What happens during an annual well check? The services and screenings done by your health care provider during your annual well check will depend on your age, overall health, lifestyle risk factors, and family history of disease. Counseling  Your health care provider may ask you questions about your: Alcohol use. Tobacco use. Drug use. Emotional well-being. Home and relationship well-being. Sexual activity. Eating habits. History of falls. Memory and ability to understand (cognition). Work and work Statistician. Screening  You may have the following tests or measurements: Height, weight, and BMI. Blood pressure. Lipid and cholesterol levels. These may be checked every 5 years, or more frequently if you are over 15 years old. Skin check. Lung cancer screening. You may have this screening every year starting at age 60 if you have a 30-pack-year history of smoking and currently smoke or have quit within the past 15 years. Fecal occult  blood test (FOBT) of the stool. You may have this test every year starting at age 54. Flexible sigmoidoscopy or colonoscopy. You may have a sigmoidoscopy every 5 years or a colonoscopy every 10 years starting at age 58. Prostate cancer screening. Recommendations will vary depending on your family history and other risks. Hepatitis C blood test. Hepatitis B blood test. Sexually transmitted disease (STD) testing. Diabetes screening. This is done by checking your blood sugar (glucose) after you have not eaten for a while (fasting). You may  have this done every 1-3 years. Abdominal aortic aneurysm (AAA) screening. You may need this if you are a current or former smoker. Osteoporosis. You may be screened starting at age 57 if you are at high risk. Talk with your health care provider about your test results, treatment options, and if necessary, the need for more tests. Vaccines  Your health care provider may recommend certain vaccines, such as: Influenza vaccine. This is recommended every year. Tetanus, diphtheria, and acellular pertussis (Tdap, Td) vaccine. You may need a Td booster every 10 years. Zoster vaccine. You may need this after age 78. Pneumococcal 13-valent conjugate (PCV13) vaccine. One dose is recommended after age 78. Pneumococcal polysaccharide (PPSV23) vaccine. One dose is recommended after age 78. Talk to your health care provider about which screenings and vaccines you need and how often you need them. This information is not intended to replace advice given to you by your health care provider. Make sure you discuss any questions you have with your health care provider. Document Released: 03/09/2015 Document Revised: 10/31/2015 Document Reviewed: 12/12/2014 Elsevier Interactive Patient Education  2017 [employer] Prevention in the Home Falls can cause injuries. They can happen to people of all ages. There are many things you can do to make your home safe and to help prevent falls. What can I do on the outside of my home? Regularly fix the edges of walkways and driveways and fix any cracks. Remove anything that might make you trip as you walk through a door, such as a raised step or threshold. Trim any bushes or trees on the path to your home. Use bright outdoor lighting. Clear any walking paths of anything that might make someone trip, such as rocks or tools. Regularly check to see if handrails are loose or broken. Make sure that both sides of any steps have handrails. Any raised decks and porches  should have guardrails on the edges. Have any leaves, snow, or ice cleared regularly. Use sand or salt on walking paths during winter. Clean up any spills in your garage right away. This includes oil or grease spills. What can I do in the bathroom? Use night lights. Install grab bars by the toilet and in the tub and shower. Do not use towel bars as grab bars. Use non-skid mats or decals in the tub or shower. If you need to sit down in the shower, use a plastic, non-slip stool. Keep the floor dry. Clean up any water that spills on the floor as soon as it happens. Remove soap buildup in the tub or shower regularly. Attach bath mats securely with double-sided non-slip rug tape. Do not have throw rugs and other things on the floor that can make you trip. What can I do in the bedroom? Use night lights. Make sure that you have a light by your bed that is easy to reach. Do not use any sheets or blankets that are too big for your bed. They should not  hang down onto the floor. Have a firm chair that has side arms. You can use this for support while you get dressed. Do not have throw rugs and other things on the floor that can make you trip. What can I do in the kitchen? Clean up any spills right away. Avoid walking on wet floors. Keep items that you use a lot in easy-to-reach places. If you need to reach something above you, use a strong step stool that has a grab bar. Keep electrical cords out of the way. Do not use floor polish or wax that makes floors slippery. If you must use wax, use non-skid floor wax. Do not have throw rugs and other things on the floor that can make you trip. What can I do with my stairs? Do not leave any items on the stairs. Make sure that there are handrails on both sides of the stairs and use them. Fix handrails that are broken or loose. Make sure that handrails are as long as the stairways. Check any carpeting to make sure that it is firmly attached to the stairs.  Fix any carpet that is loose or worn. Avoid having throw rugs at the top or bottom of the stairs. If you do have throw rugs, attach them to the floor with carpet tape. Make sure that you have a light switch at the top of the stairs and the bottom of the stairs. If you do not have them, ask someone to add them for you. What else can I do to help prevent falls? Wear shoes that: Do not have high heels. Have rubber bottoms. Are comfortable and fit you well. Are closed at the toe. Do not wear sandals. If you use a stepladder: Make sure that it is fully opened. Do not climb a closed stepladder. Make sure that both sides of the stepladder are locked into place. Ask someone to hold it for you, if possible. Clearly mark and make sure that you can see: Any grab bars or handrails. First and last steps. Where the edge of each step is. Use tools that help you move around (mobility aids) if they are needed. These include: Canes. Walkers. Scooters. Crutches. Turn on the lights when you go into a dark area. Replace any light bulbs as soon as they burn out. Set up your furniture so you have a clear path. Avoid moving your furniture around. If any of your floors are uneven, fix them. If there are any pets around you, be aware of where they are. Review your medicines with your doctor. Some medicines can make you feel dizzy. This can increase your chance of falling. Ask your doctor what other things that you can do to help prevent falls. This information is not intended to replace advice given to you by your health care provider. Make sure you discuss any questions you have with your health care provider. Document Released: 12/07/2008 Document Revised: 07/19/2015 Document Reviewed: 03/17/2014 Elsevier Interactive Patient Education  2017 [employer].

## 2022-04-24 NOTE — Progress Notes (Signed)
I connected with  Christopher [name] on 04/24/22 by a audio enabled telemedicine application and verified that I am speaking with the correct person using two identifiers.  Patient Location: Home  Provider Location: Office/Clinic  I discussed the limitations of evaluation and management by telemedicine. The patient expressed understanding and agreed to proceed.  Subjective:   Christopher [name] is a 77 y.o. male who presents for Medicare Annual/Subsequent preventive examination.  Review of Systems    Cardiac Risk Factors include: advanced age (>37mn, >>52women);male gender;dyslipidemia;sedentary lifestyle     Objective:    Today's Vitals   04/24/22 1034  Weight: 230 lb (104.3 kg)  Height: 5' 11.75" (1.822 m)   Body mass index is 31.41 kg/m.     04/24/2022   10:43 AM 04/23/2021    1:58 PM 03/16/2020    8:55 AM 01/24/2019    2:01 PM 01/14/2018   10:04 AM 01/13/2017   10:59 AM 11/08/2015   10:44 AM  Advanced Directives  Does Patient Have a Medical Advance Directive? Yes No No No No No Yes  Type of AParamedicof [place] will      [place] Does patient want to make changes to medical advance directive?       No - Patient declined  Copy of [place] Chart? No - copy requested      No - copy requested  Would patient like information on creating a medical advance directive?  Yes (MAU/Ambulatory/Procedural Areas - Information given) No - Patient declined No - Patient declined No - Patient declined No - Patient declined     Current Medications (verified) Outpatient Encounter Medications as of 04/24/2022  Medication Sig   aspirin EC 81 MG tablet Take 81 mg by mouth once as needed.   atorvastatin (LIPITOR) 10 MG tablet Take 0.5 tablets (5 mg total) by mouth daily.   sildenafil (VIAGRA) 100 MG tablet Take 1 tablet (100 mg total) by mouth daily as needed for erectile dysfunction.   No facility-administered encounter  medications on file as of 04/24/2022.    Allergies (verified) Patient has no known allergies.   History: Past Medical History:  Diagnosis Date   History of chicken pox    History reviewed. No pertinent surgical history. Family History  Problem Relation Age of Onset   Diabetes Mother    Heart disease Father    Social History   Socioeconomic History   Marital status: DSoil scientist   Spouse name: Not on file   Number of children: Not on file   Years of education: Not on file   Highest education level: Not on file  Occupational History   Not on file  Tobacco Use   Smoking status: Former    Packs/day: 1.00    Years: 16.00    Total pack years: 16.00    Types: Cigarettes    Quit date: 06/29/1982    Years since quitting: 39.8   Smokeless tobacco: Never  Vaping Use   Vaping Use: Never used  Substance and Sexual Activity   Alcohol use: Yes    Alcohol/week: 3.0 standard drinks of alcohol    Types: 3 Cans of beer per week    Comment: occasional   Drug use: No   Sexual activity: Yes  Other Topics Concern   Not on file  Social History Narrative   Divorced, Has 5 children.   Social Determinants of Health   Financial Resource Strain: Low Risk  (  04/24/2022)   Overall Financial Resource Strain (CARDIA)    Difficulty of Paying Living Expenses: Not hard at all  Food Insecurity: No Food Insecurity (04/24/2022)   Hunger Vital Sign    Worried About Running Out of Food in the Last Year: Never true    Ran Out of Food in the Last Year: Never true  Transportation Needs: No Transportation Needs (04/24/2022)   PRAPARE - Hydrologist (Medical): No    Lack of Transportation (Non-Medical): No  Physical Activity: Insufficiently Active (04/24/2022)   Exercise Vital Sign    Days of Exercise per Week: 2 days    Minutes of Exercise per Session: 20 min  Stress: No Stress Concern Present (04/24/2022)   [place]    Feeling of Stress : Not at all  Social Connections: Moderately Isolated (04/24/2022)   Social Connection and Isolation Panel [NHANES]    Frequency of Communication with Friends and Family: Twice a week    Frequency of Social Gatherings with Friends and Family: Twice a week    Attends Religious Services: Never    Printmaker: No    Attends Music therapist: Never    Marital Status: Living with partner    Tobacco Counseling Counseling given: Not Answered   Clinical Intake:  Pre-visit preparation completed: Yes  Pain : No/denies pain     Nutritional Risks: None Diabetes: No  How often do you need to have someone help you when you read instructions, pamphlets, or other written materials from your doctor or pharmacy?: 1 - Never  Diabetic? no  Interpreter Needed?: No  Information entered by :: C.Gelisa Tieken LPN   Activities of Daily Living    04/24/2022   10:44 AM  In your present state of health, do you have any difficulty performing the following activities:  Hearing? 0  Vision? 0  Difficulty concentrating or making decisions? 0  Walking or climbing stairs? 0  Dressing or bathing? 0  Doing errands, shopping? 0  Preparing Food and eating ? N  Using the Toilet? N  In the past six months, have you accidently leaked urine? Y  Comment occasionally if waits to long  Do you have problems with loss of bowel control? N  Managing your Medications? N  Managing your Finances? N  Housekeeping or managing your Housekeeping? N    Patient Care Team: Jinny Sanders, MD as PCP - General (Family Medicine) Thelma Comp, [place] as Consulting Physician (Optometry)  Indicate any recent Medical Services you may have received from other than [facility] providers in the past year (date may be approximate).     Assessment:   This is a routine wellness examination for [name].  Hearing/Vision screen Hearing Screening - Comments:: No  aids Vision Screening - Comments:: Readers - [facility]  Dietary issues and exercise activities discussed: Current Exercise Habits: Home exercise routine, Type of exercise: walking, Time (Minutes): 20, Frequency (Times/Week): 2, Weekly Exercise (Minutes/Week): 40, Intensity: Mild   Goals Addressed             This Visit's Progress    Patient Stated       Lose about 20lb.       Depression Screen    04/24/2022   10:41 AM 04/23/2021    2:00 PM 03/16/2020    8:56 AM 01/24/2019    2:02 PM 01/14/2018   10:05 AM 01/13/2017   11:00  AM 11/08/2015   10:44 AM  PHQ 2/9 Scores  PHQ - 2 Score 0 0 0 0 0 0 0  PHQ- 9 Score   0 0 0 3     Fall Risk    04/24/2022   10:36 AM 04/23/2021    1:59 PM 03/16/2020    8:56 AM 01/24/2019    2:02 PM 01/14/2018   10:05 AM  Fall Risk   Falls in the past year? 1 0 0 0 0  Number falls in past yr: 0 0 0 0   Injury with Fall? 0 0 0 0   Risk for fall due to : Other (Comment) No Fall Risks No Fall Risks    Risk for fall due to: Comment Stumbled while turning      Follow up Falls evaluation completed;Education provided;Falls prevention discussed Falls prevention discussed Falls evaluation completed;Falls prevention discussed Falls evaluation completed;Falls prevention discussed     FALL RISK PREVENTION PERTAINING TO THE HOME:  Any stairs in or around the home? No  If so, are there any without handrails? No  Home free of loose throw rugs in walkways, pet beds, electrical cords, etc? Yes  Adequate lighting in your home to reduce risk of falls? Yes   ASSISTIVE DEVICES UTILIZED TO PREVENT FALLS:  Life alert? No  Use of a cane, walker or w/c? No  Grab bars in the bathroom? No  Shower chair or bench in shower? No  Elevated toilet seat or a handicapped toilet? No    Cognitive Function:    03/16/2020    8:57 AM 01/24/2019    2:04 PM 01/14/2018   10:05 AM 01/13/2017   11:00 AM 11/08/2015   10:47 AM  MMSE - Mini Mental State Exam   Orientation to time '5 5 5 5 5  '$ Orientation to Place '5 5 5 5 5  '$ Registration '3 3 3 3 3  '$ Attention/ Calculation 5 5 0 0 0  Recall '3 3 3 3 3  '$ Language- name 2 objects   0 0 0  Language- repeat '1 1 1 1 1  '$ Language- follow 3 step command   '3 3 3  '$ Language- read & follow direction   0 0 0  Write a sentence   0 0 0  Copy design   0 0 0  Total score   '20 20 20        '$ 04/24/2022   10:45 AM  6CIT Screen  What Year? 0 points  What month? 0 points  What time? 0 points  Count back from 20 0 points  Months in reverse 0 points  Repeat phrase 2 points  Total Score 2 points    Immunizations Immunization History  Administered Date(s) Administered   COVID-19, mRNA, vaccine(Comirnaty)12 years and older 02/10/2022   Fluad Quad(high Dose 65+) 10/28/2018, 12/17/2019, 12/13/2021   Influenza, High Dose Seasonal PF 12/11/2020   Influenza,inj,Quad PF,6+ Mos 03/21/2016, 01/13/2017, 12/10/2017   PFIZER(Purple Top)SARS-COV-2 Vaccination 04/05/2019, 04/26/2019, 12/27/2019, 08/08/2020   Pfizer Covid-19 Vaccine Bivalent Booster 76yr & up 12/11/2020   Pneumococcal Conjugate-13 10/05/2014   Pneumococcal Polysaccharide-23 11/08/2015   Tdap 07/29/2013   Zoster Recombinat (Shingrix) 04/18/2021, 06/19/2021   Zoster, Live 12/06/2012    TDAP status: Up to date  Flu Vaccine status: Up to date  Pneumococcal vaccine status: Up to date  Covid-19 vaccine status: Completed vaccines  Qualifies for Shingles Vaccine? No   Zostavax completed Yes   Shingrix Completed?: Yes  Screening Tests Health Maintenance  Topic  Date Due   Medicare Annual Wellness (AWV)  04/24/2023   DTaP/Tdap/Td (2 - Td or Tdap) 07/30/2023   Pneumonia Vaccine 50+ Years old  Completed   INFLUENZA VACCINE  Completed   COVID-19 Vaccine  Completed   Hepatitis C Screening  Completed   Zoster Vaccines- Shingrix  Completed   HPV VACCINES  Aged Out   COLONOSCOPY (Pts 45-84yr Insurance coverage will need to be confirmed)  Discontinued     Health Maintenance  There are no preventive care reminders to display for this patient.   Colorectal cancer screening: No longer required.   Lung Cancer Screening: (Low Dose CT Chest recommended if Age 78-80years, 30 pack-year currently smoking OR have quit w/in 15years.) does not qualify.   Lung Cancer Screening Referral: no  Additional Screening:  Hepatitis C Screening: does not qualify; Completed 11/08/2015  Vision Screening: Recommended annual ophthalmology exams for early detection of glaucoma and other disorders of the eye. Is the patient up to date with their annual eye exam?  No  pt will call for appointment Who is the provider or what is the name of the office in which the patient attends annual eye exams? [facility] pt is not established with a provider, would they like to be referred to a provider to establish care? No .   Dental Screening: Recommended annual dental exams for proper oral hygiene  Community Resource Referral / Chronic Care Management: CRR required this visit?  No   CCM required this visit?  No      Plan:     I have personally reviewed and noted the following in the patient's chart:   Medical and social history Use of alcohol, tobacco or illicit drugs  Current medications and supplements including opioid prescriptions. Patient is not currently taking opioid prescriptions. Functional ability and status Nutritional status Physical activity Advanced directives List of other physicians Hospitalizations, surgeries, and ER visits in previous 12 months Vitals Screenings to include cognitive, depression, and falls Referrals and appointments  In addition, I have reviewed and discussed with patient certain preventive protocols, quality metrics, and best practice recommendations. A written personalized care plan for preventive services as well as general preventive health recommendations were provided to patient.     CLebron Conners LPN   [id number]  Nurse Notes: none

## 2022-04-25 ENCOUNTER — Telehealth: Payer: Self-pay | Admitting: *Deleted

## 2022-04-25 ENCOUNTER — Telehealth: Payer: Self-pay | Admitting: Family Medicine

## 2022-04-25 DIAGNOSIS — E7800 Pure hypercholesterolemia, unspecified: Secondary | ICD-10-CM

## 2022-04-25 NOTE — Telephone Encounter (Signed)
-----   Message from Velna Hatchet, RT sent at 04/15/2022  2:08 PM EST ----- Regarding: Thu 3/7 lab Patient is scheduled for cpx, please order future labs.  Thanks, Anda Kraft

## 2022-05-01 ENCOUNTER — Other Ambulatory Visit (INDEPENDENT_AMBULATORY_CARE_PROVIDER_SITE_OTHER): Payer: Medicare PPO

## 2022-05-01 DIAGNOSIS — E7800 Pure hypercholesterolemia, unspecified: Secondary | ICD-10-CM

## 2022-05-01 LAB — COMPREHENSIVE METABOLIC PANEL
ALT: 19 U/L (ref 0–53)
AST: 23 U/L (ref 0–37)
Albumin: 4.1 g/dL (ref 3.5–5.2)
Alkaline Phosphatase: 56 U/L (ref 39–117)
BUN: 11 mg/dL (ref 6–23)
CO2: 27 mEq/L (ref 19–32)
Calcium: 9.4 mg/dL (ref 8.4–10.5)
Chloride: 106 mEq/L (ref 96–112)
Creatinine, Ser: 1.37 mg/dL (ref 0.40–1.50)
GFR: 49.75 mL/min — ABNORMAL LOW (ref >60.00)
Glucose, Bld: 101 mg/dL — ABNORMAL HIGH (ref 70–99)
Potassium: 3.9 mEq/L (ref 3.5–5.1)
Sodium: 141 mEq/L (ref 135–145)
Total Bilirubin: 1.2 mg/dL (ref 0.2–1.2)
Total Protein: 7.1 g/dL (ref 6.0–8.3)

## 2022-05-01 LAB — LIPID PANEL
Cholesterol: 138 mg/dL (ref 0–200)
HDL: 43.2 mg/dL (ref >39.00)
LDL Cholesterol: 78 mg/dL (ref 0–99)
NonHDL: 95.12
Total CHOL/HDL Ratio: 3
Triglycerides: 85 mg/dL (ref 0.0–149.0)
VLDL: 17 mg/dL (ref 0.0–40.0)

## 2022-05-01 NOTE — Progress Notes (Signed)
No critical labs need to be addressed urgently. We will discuss labs in detail at upcoming office visit.   

## 2022-05-08 ENCOUNTER — Encounter: Payer: Self-pay | Admitting: Family Medicine

## 2022-05-08 ENCOUNTER — Ambulatory Visit (INDEPENDENT_AMBULATORY_CARE_PROVIDER_SITE_OTHER): Payer: Medicare PPO | Admitting: Family Medicine

## 2022-05-08 VITALS — BP 100/64 | HR 74 | Temp 98.0°F | Ht 71.25 in | Wt 224.4 lb

## 2022-05-08 DIAGNOSIS — Z0000 Encounter for general adult medical examination without abnormal findings: Secondary | ICD-10-CM

## 2022-05-08 DIAGNOSIS — E786 Lipoprotein deficiency: Secondary | ICD-10-CM

## 2022-05-08 DIAGNOSIS — R7303 Prediabetes: Secondary | ICD-10-CM

## 2022-05-08 DIAGNOSIS — E7800 Pure hypercholesterolemia, unspecified: Secondary | ICD-10-CM | POA: Diagnosis not present

## 2022-05-08 NOTE — Progress Notes (Signed)
Patient ID: Christopher [name], male    DOB: Jul 04, 1945, 77 y.o.   MRN: [id number]  This visit was conducted in person.  BP 100/64   Pulse 74   Temp 98 F (36.7 C) (Temporal)   Ht 5' 11.25" (1.81 m)   Wt 224 lb 6 oz (101.8 kg)   SpO2 96%   BMI 31.07 kg/m    CC:  Chief Complaint  Patient presents with   Annual Exam    Part 2    Subjective:   HPI: Christopher [name] is a 77 y.o. male presenting on 05/08/2022 for Annual Exam (Part 2)  The patient presents for  complete physical and review of chronic health problems. He/She also has the following acute concerns today: none  The patient saw a LPN or RN for medicare wellness visit.  Prevention and wellness was reviewed in detail. Note reviewed and important notes copied below.  BP Readings from Last 3 Encounters:  05/08/22 100/64  04/30/21 102/72  03/16/20 110/72   Wt Readings from Last 3 Encounters:  05/08/22 224 lb 6 oz (101.8 kg)  04/24/22 230 lb (104.3 kg)  04/30/21 225 lb 7 oz (102.3 kg)     Elevated Cholesterol:  LDL improved back on atorvastatin 10 mg daily  1/2 tablet. Lab Results  Component Value Date   CHOL 138 05/01/2022   HDL 43.20 05/01/2022   LDLCALC 78 05/01/2022   TRIG 85.0 05/01/2022   CHOLHDL 3 05/01/2022  Using medications without problems: none The 10-year ASCVD risk score (Arnett DK, et al., 2019) is: 9%   Values used to calculate the score:     Age: 53 years     Sex: Male     Is Non-Hispanic African American: Yes     Diabetic: No     Tobacco smoker: No     Systolic Blood Pressure: 123XX123 mmHg     Is BP treated: No     HDL Cholesterol: 43.2 mg/dL     Total Cholesterol: 138 mg/dL Muscle aches:  Diet compliance: fruits and veggies, water, lean protein Exercise: working  in yard, walking  Other complaints:  Wt Readings from Last 3 Encounters:  05/08/22 224 lb 6 oz (101.8 kg)  04/24/22 230 lb (104.3 kg)  04/30/21 225 lb 7 oz (102.3 kg)  Body mass index is 31.07 kg/m.     Slight decrease  in GFR.Marland Kitchen now at 66  Relevant past medical, surgical, family and social history reviewed and updated as indicated. Interim medical history since our last visit reviewed. Allergies and medications reviewed and updated. Outpatient Medications Prior to Visit  Medication Sig Dispense Refill   aspirin EC 81 MG tablet Take 81 mg by mouth once as needed.     atorvastatin (LIPITOR) 10 MG tablet Take 0.5 tablets (5 mg total) by mouth daily. 45 tablet 0   sildenafil (VIAGRA) 100 MG tablet Take 1 tablet (100 mg total) by mouth daily as needed for erectile dysfunction. 10 tablet 0   No facility-administered medications prior to visit.     Per HPI unless specifically indicated in ROS section below Review of Systems  Constitutional:  Negative for fatigue and fever.  HENT:  Negative for ear pain.   Eyes:  Negative for pain.  Respiratory:  Negative for cough and shortness of breath.   Cardiovascular:  Negative for chest pain, palpitations and leg swelling.  Gastrointestinal:  Negative for abdominal pain.  Genitourinary:  Negative for dysuria.  Musculoskeletal:  Negative for arthralgias.  Neurological:  Negative for syncope, light-headedness and headaches.  Psychiatric/Behavioral:  Negative for dysphoric mood.    Objective:  BP 100/64   Pulse 74   Temp 98 F (36.7 C) (Temporal)   Ht 5' 11.25" (1.81 m)   Wt 224 lb 6 oz (101.8 kg)   SpO2 96%   BMI 31.07 kg/m   Wt Readings from Last 3 Encounters:  05/08/22 224 lb 6 oz (101.8 kg)  04/24/22 230 lb (104.3 kg)  04/30/21 225 lb 7 oz (102.3 kg)      Physical Exam Constitutional:      General: He is not in acute distress.    Appearance: Normal appearance. He is well-developed. He is not ill-appearing or toxic-appearing.  HENT:     Head: Normocephalic and atraumatic.     Right Ear: Hearing, tympanic membrane, ear canal and external ear normal.     Left Ear: Hearing, tympanic membrane, ear canal and external ear normal.     Nose: Nose normal.      Mouth/Throat:     Pharynx: Uvula midline.  Eyes:     General: Lids are normal. Lids are everted, no foreign bodies appreciated.     Conjunctiva/sclera: Conjunctivae normal.     Pupils: Pupils are equal, round, and reactive to light.  Neck:     Thyroid: No thyroid mass or thyromegaly.     Vascular: No carotid bruit.     Trachea: Trachea and phonation normal.  Cardiovascular:     Rate and Rhythm: Normal rate and regular rhythm.     Pulses: Normal pulses.     Heart sounds: S1 normal and S2 normal. No murmur heard.    No gallop.  Pulmonary:     Breath sounds: Normal breath sounds. No wheezing, rhonchi or rales.  Abdominal:     General: Bowel sounds are normal.     Palpations: Abdomen is soft.     Tenderness: There is no abdominal tenderness. There is no guarding or rebound.     Hernia: No hernia is present.  Musculoskeletal:     Cervical back: Normal range of motion and neck supple.  Lymphadenopathy:     Cervical: No cervical adenopathy.  Skin:    General: Skin is warm and dry.     Findings: No rash.  Neurological:     Mental Status: He is alert.     Cranial Nerves: No cranial nerve deficit.     Sensory: No sensory deficit.     Gait: Gait normal.     Deep Tendon Reflexes: Reflexes are normal and symmetric.  Psychiatric:        Speech: Speech normal.        Behavior: Behavior normal.        Judgment: Judgment normal.      Results for orders placed or performed in visit on 05/01/22  Lipid panel  Result Value Ref Range   Cholesterol 138 0 - 200 mg/dL   Triglycerides 85.0 0.0 - 149.0 mg/dL   HDL 43.20 >39.00 mg/dL   VLDL 17.0 0.0 - 40.0 mg/dL   LDL Cholesterol 78 0 - 99 mg/dL   Total CHOL/HDL Ratio 3    NonHDL 95.12   Comprehensive metabolic panel  Result Value Ref Range   Sodium 141 135 - 145 mEq/L   Potassium 3.9 3.5 - 5.1 mEq/L   Chloride 106 96 - 112 mEq/L   CO2 27 19 - 32 mEq/L   Glucose, Bld 101 (H) 70 - 99 mg/dL  BUN 11 6 - 23 mg/dL   Creatinine, Ser 1.37  0.40 - 1.50 mg/dL   Total Bilirubin 1.2 0.2 - 1.2 mg/dL   Alkaline Phosphatase 56 39 - 117 U/L   AST 23 0 - 37 U/L   ALT 19 0 - 53 U/L   Total Protein 7.1 6.0 - 8.3 g/dL   Albumin 4.1 3.5 - 5.2 g/dL   GFR 49.75 (L) >60.00 mL/min   Calcium 9.4 8.4 - 10.5 mg/dL    This visit occurred during the SARS-CoV-2 public health emergency.  Safety protocols were in place, including screening questions prior to the visit, additional usage of staff PPE, and extensive cleaning of exam room while observing appropriate contact time as indicated for disinfecting solutions.   COVID 19 screen:  No recent travel or known exposure to COVID19 The patient denies respiratory symptoms of COVID 19 at this time. The importance of social distancing was discussed today.   Assessment and Plan The patient's preventative maintenance and recommended screening tests for an annual wellness exam were reviewed in full today. Brought up to date unless services declined.  Counselled on the importance of diet, exercise, and its role in overall health and mortality. The patient's FH and SH was reviewed, including their home life, tobacco status, and drug and alcohol status.   Vaccines: uptodate tdap, PNA, flu,COVID, Shingrix x 2 Prostate: No prostate cancer in family. No indication for  PSA/ prostate exam given age. Hep C: Neg. Colon: 2009 polyps, repeat in 10 years. Dr. Michail Sermon.. Neg IFOB  05/2018.Marland Kitchen no further eval needed. Former smoker:  Remotely, asymptomatic.   Problem List Items Addressed This Visit     Pure hypercholesterolemia    Stable, chronic.  Continue current medication.   Atorvastatin  10 mg 1/2 tablet daily      Routine general medical examination at a health care facility - Primary   Eliezer Lofts, MD

## 2022-05-08 NOTE — Assessment & Plan Note (Signed)
Chronic, improved above 40 this year.

## 2022-05-08 NOTE — Assessment & Plan Note (Signed)
Acute, borderline.  Discussed a low-carb diet, regular exercise and weight management.

## 2022-05-08 NOTE — Assessment & Plan Note (Signed)
Stable, chronic.  Continue current medication.   Atorvastatin  10 mg 1/2 tablet daily

## 2022-07-07 ENCOUNTER — Other Ambulatory Visit: Payer: Self-pay | Admitting: Family Medicine

## 2022-12-02 ENCOUNTER — Ambulatory Visit: Payer: Medicare PPO

## 2023-03-03 ENCOUNTER — Other Ambulatory Visit: Payer: Self-pay | Admitting: Family Medicine

## 2023-03-03 MED ORDER — ATORVASTATIN CALCIUM 10 MG PO TABS: 15.0000 mg | ORAL_TABLET | Freq: Every day | ORAL | 2 refills | Status: DC

## 2023-03-03 NOTE — Telephone Encounter (Signed)
 Copied from CRM [id number]. Topic: Clinical - Medication Refill >> Mar 03, 2023  2:13 PM Mercedes MATSU wrote: Most Recent Primary Care Visit:  Provider: AVELINA NO E  Department: [facility]  Visit Type: PHYSICAL  Date: 05/08/2022  Medication: sildenafil  (VIAGRA ) 50 MG tablet  Has the patient contacted their pharmacy? Yes (Agent: If no, request that the patient contact the pharmacy for the refill. If patient does not wish to contact the pharmacy document the reason why and proceed with request.) (Agent: If yes, when and what did the pharmacy advise?)  Is this the correct pharmacy for this prescription? No If no, delete pharmacy and type the correct one.  This is the patient's preferred pharmacy:   [facility] [address], [place], [place] [address] [phone]   Has the prescription been filled recently? No  Is the patient out of the medication? Yes  Has the patient been seen for an appointment in the last year OR does the patient have an upcoming appointment? Yes  Can we respond through MyChart? No  Agent: Please be advised that Rx refills may take up to 3 business days. We ask that you follow-up with your pharmacy.

## 2023-03-16 ENCOUNTER — Telehealth: Payer: Self-pay | Admitting: Family Medicine

## 2023-03-16 NOTE — Telephone Encounter (Signed)
Christopher [name] is asking for a refill on Viagra.  He is asking for 50 mg tablets sent to [facility].  Currently there is 100 mg and 20 mg on his medication list.  Okay to send in Rx for 50 mg as requested.  Last appt:  05/08/2022 for CPE and he already has his CPE scheduled for 05/12/2023.  Please advise.

## 2023-03-17 MED ORDER — SILDENAFIL CITRATE 50 MG PO TABS: 50.0000 mg | ORAL_TABLET | Freq: Every day | ORAL | 2 refills | Status: DC | PRN

## 2023-03-17 NOTE — Telephone Encounter (Signed)
Refilled as requested  

## 2023-04-20 ENCOUNTER — Telehealth: Payer: Self-pay | Admitting: *Deleted

## 2023-04-20 DIAGNOSIS — E7800 Pure hypercholesterolemia, unspecified: Secondary | ICD-10-CM

## 2023-04-20 DIAGNOSIS — R7303 Prediabetes: Secondary | ICD-10-CM

## 2023-04-20 NOTE — Telephone Encounter (Signed)
-----   Message from Lovena Neighbours sent at 04/20/2023 10:49 AM EST ----- Regarding: Labs for Tuesday 3.11.25 Please put fasting lab orders in future. Thank you, Denny Peon

## 2023-04-30 ENCOUNTER — Ambulatory Visit: Payer: Medicare PPO

## 2023-04-30 VITALS — Ht 71.25 in | Wt 224.0 lb

## 2023-04-30 DIAGNOSIS — Z0000 Encounter for general adult medical examination without abnormal findings: Secondary | ICD-10-CM | POA: Diagnosis not present

## 2023-04-30 NOTE — Progress Notes (Signed)
 Subjective:   Christopher [name] is a 78 y.o. who presents for a Medicare Wellness preventive visit.  Visit Complete: Virtual I connected with  [name] on 04/30/23 by a audio enabled telemedicine application and verified that I am speaking with the correct person using two identifiers.  Patient Location: Home  Provider Location: Office/Clinic  I discussed the limitations of evaluation and management by telemedicine. The patient expressed understanding and agreed to proceed.  Vital Signs: Because this visit was a virtual/telehealth visit, some criteria may be missing or patient reported. Any vitals not documented were not able to be obtained and vitals that have been documented are patient reported.  VideoDeclined- This patient declined Librarian, academic. Therefore the visit was completed with audio only.  AWV Questionnaire: No: Patient Medicare AWV questionnaire was not completed prior to this visit.  Cardiac Risk Factors include: advanced age (>44men, >3 women);dyslipidemia;male gender;obesity (BMI >30kg/m2)     Objective:    Today's Vitals   04/30/23 0936  Weight: 224 lb (101.6 kg)  Height: 5' 11.25" (1.81 m)   Body mass index is 31.02 kg/m.     04/30/2023    9:44 AM 04/24/2022   10:43 AM 04/23/2021    1:58 PM 03/16/2020    8:55 AM 01/24/2019    2:01 PM 01/14/2018   10:04 AM 01/13/2017   10:59 AM  Advanced Directives  Does Patient Have a Medical Advance Directive? No Yes No No No No No  Type of Furniture conservator/restorer;Living will       Copy of Healthcare Power of Attorney in Chart?  No - copy requested       Would patient like information on creating a medical advance directive?   Yes (MAU/Ambulatory/Procedural Areas - Information given) No - Patient declined No - Patient declined No - Patient declined No - Patient declined    Current Medications (verified) Outpatient Encounter Medications as of 04/30/2023   Medication Sig   aspirin EC 81 MG tablet Take 81 mg by mouth once as needed.   atorvastatin (LIPITOR) 10 MG tablet Take 1.5 tablets (15 mg total) by mouth daily.   sildenafil (REVATIO) 20 MG tablet Take 20 mg by mouth 3 (three) times daily.   sildenafil (VIAGRA) 50 MG tablet Take 1 tablet (50 mg total) by mouth daily as needed for erectile dysfunction.   No facility-administered encounter medications on file as of 04/30/2023.    Allergies (verified) Patient has no known allergies.   History: Past Medical History:  Diagnosis Date   History of chicken pox    No past surgical history on file. Family History  Problem Relation Age of Onset   Diabetes Mother    Heart disease Father    Social History   Socioeconomic History   Marital status: Media planner    Spouse name: Not on file   Number of children: Not on file   Years of education: Not on file   Highest education level: Not on file  Occupational History   Not on file  Tobacco Use   Smoking status: Former    Current packs/day: 0.00    Average packs/day: 1 pack/day for 16.0 years (16.0 ttl pk-yrs)    Types: Cigarettes    Start date: 06/29/1966    Quit date: 06/29/1982    Years since quitting: 40.8   Smokeless tobacco: Never  Vaping Use   Vaping status: Never Used  Substance and Sexual Activity   Alcohol use:  Yes    Alcohol/week: 3.0 standard drinks of alcohol    Types: 3 Cans of beer per week    Comment: occasional   Drug use: No   Sexual activity: Yes  Other Topics Concern   Not on file  Social History Narrative   Divorced, Has 5 children.   Social Drivers of Corporate investment banker Strain: Low Risk  (04/30/2023)   Overall Financial Resource Strain (CARDIA)    Difficulty of Paying Living Expenses: Not hard at all  Food Insecurity: No Food Insecurity (04/30/2023)   Hunger Vital Sign    Worried About Running Out of Food in the Last Year: Never true    Ran Out of Food in the Last Year: Never true   Transportation Needs: No Transportation Needs (04/30/2023)   PRAPARE - Administrator, Civil Service (Medical): No    Lack of Transportation (Non-Medical): No  Physical Activity: Sufficiently Active (04/30/2023)   Exercise Vital Sign    Days of Exercise per Week: 6 days    Minutes of Exercise per Session: 30 min  Stress: No Stress Concern Present (04/24/2022)   [employer] of Occupational Health - Occupational Stress Questionnaire    Feeling of Stress : Not at all  Social Connections: Moderately Isolated (04/30/2023)   Social Connection and Isolation Panel [NHANES]    Frequency of Communication with Friends and Family: Twice a week    Frequency of Social Gatherings with Friends and Family: Twice a week    Attends Religious Services: Never    Database administrator or Organizations: No    Attends Engineer, structural: Never    Marital Status: Living with partner    Tobacco Counseling Counseling given: Not Answered    Clinical Intake:  Pre-visit preparation completed: Yes  Pain : No/denies pain     BMI - recorded: 31.02 Nutritional Status: BMI > 30  Obese Nutritional Risks: None Diabetes: No  How often do you need to have someone help you when you read instructions, pamphlets, or other written materials from your doctor or pharmacy?: 1 - Never  Interpreter Needed?: No  Comments: lives with partner Information entered by :: B.Ziah Leandro,LPN   Activities of Daily Living     04/30/2023    9:44 AM  In your present state of health, do you have any difficulty performing the following activities:  Hearing? 0  Vision? 0  Difficulty concentrating or making decisions? 1  Walking or climbing stairs? 0  Dressing or bathing? 0  Doing errands, shopping? 0  Preparing Food and eating ? N  Using the Toilet? N  In the past six months, have you accidently leaked urine? N  Do you have problems with loss of bowel control? N  Managing your Medications? N   Managing your Finances? N  Housekeeping or managing your Housekeeping? N    Patient Care Team: Excell Seltzer, MD as PCP - General (Family Medicine) [facility]  Indicate any recent Medical Services you may have received from other than [facility] providers in the past year (date may be approximate).     Assessment:   This is a routine wellness examination for Christopher [name].  Hearing/Vision screen Hearing Screening - Comments:: Pt says his hearing is good Vision Screening - Comments:: Pt says he has readers only [facility]   Goals Addressed             This Visit's Progress    COMPLETED: Patient Stated   On track  03/16/2020, I will maintain and continue medications as prescribed.      Patient Stated   On track    04/30/23-Would like to drink more water, exercise and healthier  I would like to lose about 20lbs     Patient Stated   Not on track    04/30/23-Lose about 20lb.       Depression Screen     04/30/2023    9:41 AM 04/24/2022   10:41 AM 04/23/2021    2:00 PM 03/16/2020    8:56 AM 01/24/2019    2:02 PM 01/14/2018   10:05 AM 01/13/2017   11:00 AM  PHQ 2/9 Scores  PHQ - 2 Score 0 0 0 0 0 0 0  PHQ- 9 Score    0 0 0 3    Fall Risk     04/30/2023    9:38 AM 04/24/2022   10:36 AM 04/23/2021    1:59 PM 03/16/2020    8:56 AM 01/24/2019    2:02 PM  Fall Risk   Falls in the past year? 0 1 0 0 0  Number falls in past yr: 0 0 0 0 0  Injury with Fall? 0 0 0 0 0  Risk for fall due to : No Fall Risks Other (Comment) No Fall Risks No Fall Risks   Risk for fall due to: Comment  Stumbled while turning     Follow up Education provided;Falls prevention discussed Falls evaluation completed;Education provided;Falls prevention discussed Falls prevention discussed Falls evaluation completed;Falls prevention discussed Falls evaluation completed;Falls prevention discussed    MEDICARE RISK AT HOME:  Medicare Risk at Home Any stairs in or around the home?: No If so, are there any without  handrails?: No Home free of loose throw rugs in walkways, pet beds, electrical cords, etc?: Yes Adequate lighting in your home to reduce risk of falls?: Yes Life alert?: No Use of a cane, walker or w/c?: No Grab bars in the bathroom?: No Shower chair or bench in shower?: No Elevated toilet seat or a handicapped toilet?: No  TIMED UP AND GO:  Was the test performed?  No  Cognitive Function: 6CIT completed    03/16/2020    8:57 AM 01/24/2019    2:04 PM 01/14/2018   10:05 AM 01/13/2017   11:00 AM 11/08/2015   10:47 AM  MMSE - Mini Mental State Exam  Orientation to time 5 5 5 5 5   Orientation to Place 5 5 5 5 5   Registration 3 3 3 3 3   Attention/ Calculation 5 5 0 0 0  Recall 3 3 3 3 3   Language- name 2 objects   0 0 0  Language- repeat 1 1 1 1 1   Language- follow 3 step command   3 3 3   Language- read & follow direction   0 0 0  Write a sentence   0 0 0  Copy design   0 0 0  Total score   20 20 20         04/30/2023    9:45 AM 04/24/2022   10:45 AM  6CIT Screen  What Year? 0 points 0 points  What month? 0 points 0 points  What time? 0 points 0 points  Count back from 20 0 points 0 points  Months in reverse 0 points 0 points  Repeat phrase 0 points 2 points  Total Score 0 points 2 points    Immunizations Immunization History  Administered Date(s) Administered   Fluad Quad(high Dose  65+) 10/28/2018, 12/17/2019, 12/13/2021   Influenza, High Dose Seasonal PF 12/11/2020   Influenza,inj,Quad PF,6+ Mos 03/21/2016, 01/13/2017, 12/10/2017   PFIZER(Purple Top)SARS-COV-2 Vaccination 04/05/2019, 04/26/2019, 12/27/2019, 08/08/2020   Pfizer Covid-19 Vaccine Bivalent Booster 26yrs & up 12/11/2020   Pfizer(Comirnaty)Fall Seasonal Vaccine 12 years and older 11/17/2022   Pneumococcal Conjugate-13 10/05/2014   Pneumococcal Polysaccharide-23 11/08/2015   Tdap 07/29/2013   Zoster Recombinant(Shingrix) 04/18/2021, 06/19/2021   Zoster, Live 12/06/2012    Screening Tests Health  Maintenance  Topic Date Due   INFLUENZA VACCINE  09/25/2022   DTaP/Tdap/Td (2 - Td or Tdap) 07/30/2023   Medicare Annual Wellness (AWV)  04/29/2024   Pneumonia Vaccine 51+ Years old  Completed   COVID-19 Vaccine  Completed   Hepatitis C Screening  Completed   Zoster Vaccines- Shingrix  Completed   HPV VACCINES  Aged Out   Colonoscopy  Discontinued    Health Maintenance  Health Maintenance Due  Topic Date Due   INFLUENZA VACCINE  09/25/2022   Health Maintenance Items Addressed:  Additional Screening:  Vision Screening: Recommended annual ophthalmology exams for early detection of glaucoma and other disorders of the eye.  Dental Screening: Recommended annual dental exams for proper oral hygiene  Community Resource Referral / Chronic Care Management: CRR required this visit?  No   CCM required this visit?  No     Plan:     I have personally reviewed and noted the following in the patient's chart:   Medical and social history Use of alcohol, tobacco or illicit drugs  Current medications and supplements including opioid prescriptions. Patient is not currently taking opioid prescriptions. Functional ability and status Nutritional status Physical activity Advanced directives List of other physicians Hospitalizations, surgeries, and ER visits in previous 12 months Vitals Screenings to include cognitive, depression, and falls Referrals and appointments  In addition, I have reviewed and discussed with patient certain preventive protocols, quality metrics, and best practice recommendations. A written personalized care plan for preventive services as well as general preventive health recommendations were provided to patient.     Sue Lush, LPN   03/01/1094   After Visit Summary: (Declined) Due to this being a telephonic visit, with patients personalized plan was offered to patient but patient Declined AVS at this time   Notes: Nothing significant to report at  this time.

## 2023-04-30 NOTE — Patient Instructions (Signed)
 [name] , Thank you for taking time to come for your Medicare Wellness Visit. I appreciate your ongoing commitment to your health goals. Please review the following plan we discussed and let me know if I can assist you in the future.   Referrals/Orders/Follow-Ups/Clinician Recommendations: none  This is a list of the screening recommended for you and due dates:  Health Maintenance  Topic Date Due   Flu Shot  09/25/2022   DTaP/Tdap/Td vaccine (2 - Td or Tdap) 07/30/2023   Medicare Annual Wellness Visit  04/29/2024   Pneumonia Vaccine  Completed   COVID-19 Vaccine  Completed   Hepatitis C Screening  Completed   Zoster (Shingles) Vaccine  Completed   HPV Vaccine  Aged Out   Colon Cancer Screening  Discontinued    Advanced directives: (Declined) Advance directive discussed with you today. Even though you declined this today, please call our office should you change your mind, and we can give you the proper paperwork for you to fill out.  Next Medicare Annual Wellness Visit scheduled for next year: Yes 05/02/24 @ 10:50am televisit

## 2023-05-05 ENCOUNTER — Other Ambulatory Visit (INDEPENDENT_AMBULATORY_CARE_PROVIDER_SITE_OTHER): Payer: Medicare PPO

## 2023-05-05 DIAGNOSIS — R7303 Prediabetes: Secondary | ICD-10-CM

## 2023-05-05 DIAGNOSIS — E7800 Pure hypercholesterolemia, unspecified: Secondary | ICD-10-CM

## 2023-05-05 LAB — LIPID PANEL
Cholesterol: 116 mg/dL (ref 0–200)
HDL: 35.4 mg/dL — ABNORMAL LOW (ref >39.00)
LDL Cholesterol: 61 mg/dL (ref 0–99)
NonHDL: 80.26
Total CHOL/HDL Ratio: 3
Triglycerides: 98 mg/dL (ref 0.0–149.0)
VLDL: 19.6 mg/dL (ref 0.0–40.0)

## 2023-05-05 LAB — COMPREHENSIVE METABOLIC PANEL
ALT: 16 U/L (ref 0–53)
AST: 17 U/L (ref 0–37)
Albumin: 4.5 g/dL (ref 3.5–5.2)
Alkaline Phosphatase: 55 U/L (ref 39–117)
BUN: 12 mg/dL (ref 6–23)
CO2: 27 meq/L (ref 19–32)
Calcium: 9.4 mg/dL (ref 8.4–10.5)
Chloride: 107 meq/L (ref 96–112)
Creatinine, Ser: 1.34 mg/dL (ref 0.40–1.50)
GFR: 50.73 mL/min — ABNORMAL LOW (ref >60.00)
Glucose, Bld: 98 mg/dL (ref 70–99)
Potassium: 4 meq/L (ref 3.5–5.1)
Sodium: 141 meq/L (ref 135–145)
Total Bilirubin: 1.3 mg/dL — ABNORMAL HIGH (ref 0.2–1.2)
Total Protein: 7.2 g/dL (ref 6.0–8.3)

## 2023-05-05 LAB — HEMOGLOBIN A1C: Hgb A1c MFr Bld: 5.3 % (ref 4.6–6.5)

## 2023-05-05 NOTE — Progress Notes (Signed)
 No critical labs need to be addressed urgently. We will discuss labs in detail at upcoming office visit.

## 2023-05-12 ENCOUNTER — Ambulatory Visit (INDEPENDENT_AMBULATORY_CARE_PROVIDER_SITE_OTHER): Payer: Medicare PPO | Admitting: Family Medicine

## 2023-05-12 ENCOUNTER — Encounter: Payer: Self-pay | Admitting: Family Medicine

## 2023-05-12 VITALS — BP 100/62 | HR 93 | Temp 98.0°F | Ht 71.0 in | Wt 222.2 lb

## 2023-05-12 DIAGNOSIS — Z0000 Encounter for general adult medical examination without abnormal findings: Secondary | ICD-10-CM

## 2023-05-12 DIAGNOSIS — R7303 Prediabetes: Secondary | ICD-10-CM

## 2023-05-12 DIAGNOSIS — E7800 Pure hypercholesterolemia, unspecified: Secondary | ICD-10-CM | POA: Diagnosis not present

## 2023-05-12 DIAGNOSIS — N182 Chronic kidney disease, stage 2 (mild): Secondary | ICD-10-CM

## 2023-05-12 NOTE — Assessment & Plan Note (Signed)
Stable, chronic.  Continue current medication.   Atorvastatin  10 mg 1/2 tablet daily 

## 2023-05-12 NOTE — Progress Notes (Signed)
 Patient ID: Christopher [name], male    DOB: February 29, 1945, 78 y.o.   MRN: [id number]  This visit was conducted in person.  BP 100/62 (BP Location: Left Arm, Patient Position: Sitting, Cuff Size: Large)   Pulse 93   Temp 98 F (36.7 C) (Temporal)   Ht 5\' 11"  (1.803 m)   Wt 222 lb 4 oz (100.8 kg)   SpO2 97%   BMI 31.00 kg/m    CC:  Chief Complaint  Patient presents with   Annual Exam    Part 2 ([facility] 04/30/23)    Subjective:   HPI: Christopher [name] is a 78 y.o. male presenting on 05/12/2023 for Annual Exam (Part 2 ([facility] 04/30/23))  The patient presents for  complete physical and review of chronic health problems. He/She also has the following acute concerns today: none  The patient saw a LPN or RN for medicare wellness visit.  04/30/2023  Prevention and wellness was reviewed in detail. Note reviewed and important notes copied below.  BP Readings from Last 3 Encounters:  05/12/23 100/62  05/08/22 100/64  04/30/21 102/72   Wt Readings from Last 3 Encounters:  05/12/23 222 lb 4 oz (100.8 kg)  04/30/23 224 lb (101.6 kg)  05/08/22 224 lb 6 oz (101.8 kg)   Elevated Cholesterol:  LDL improved back on atorvastatin 10 mg daily  1/2 tablet. Lab Results  Component Value Date   CHOL 116 05/05/2023   HDL 35.40 (L) 05/05/2023   LDLCALC 61 05/05/2023   TRIG 98.0 05/05/2023   CHOLHDL 3 05/05/2023  Using medications without problems: none The ASCVD Risk score (Arnett DK, et al., 2019) failed to calculate for the following reasons:   The valid total cholesterol range is 130 to 320 mg/dL Muscle aches:  Diet compliance: fruits and veggies, water, lean protein Exercise: working  in yard, walking  6 days a week Other complaints:  Wt Readings from Last 3 Encounters:  05/12/23 222 lb 4 oz (100.8 kg)  04/30/23 224 lb (101.6 kg)  05/08/22 224 lb 6 oz (101.8 kg)  Body mass index is 31 kg/m.     Stable decrease in GFR.Marland Kitchen now at 50.73... drinking more water  No family history of kidney  issues.  Relevant past medical, surgical, family and social history reviewed and updated as indicated. Interim medical history since our last visit reviewed. Allergies and medications reviewed and updated. Outpatient Medications Prior to Visit  Medication Sig Dispense Refill   aspirin EC 81 MG tablet Take 81 mg by mouth once as needed.     atorvastatin (LIPITOR) 10 MG tablet Take 1.5 tablets (15 mg total) by mouth daily. 45 tablet 2   loratadine-pseudoephedrine (CLARITIN-D 24-HOUR) 10-240 MG 24 hr tablet Take 1 tablet by mouth daily.     sildenafil (VIAGRA) 50 MG tablet Take 1 tablet (50 mg total) by mouth daily as needed for erectile dysfunction. 10 tablet 2   sildenafil (REVATIO) 20 MG tablet Take 20 mg by mouth 3 (three) times daily.     No facility-administered medications prior to visit.     Per HPI unless specifically indicated in ROS section below Review of Systems  Constitutional:  Negative for fatigue and fever.  HENT:  Negative for ear pain.   Eyes:  Negative for pain.  Respiratory:  Negative for cough and shortness of breath.   Cardiovascular:  Negative for chest pain, palpitations and leg swelling.  Gastrointestinal:  Negative for abdominal pain.  Genitourinary:  Negative for dysuria.  Musculoskeletal:  Negative for arthralgias.  Neurological:  Negative for syncope, light-headedness and headaches.  Psychiatric/Behavioral:  Negative for dysphoric mood.    Objective:  BP 100/62 (BP Location: Left Arm, Patient Position: Sitting, Cuff Size: Large)   Pulse 93   Temp 98 F (36.7 C) (Temporal)   Ht 5\' 11"  (1.803 m)   Wt 222 lb 4 oz (100.8 kg)   SpO2 97%   BMI 31.00 kg/m   Wt Readings from Last 3 Encounters:  05/12/23 222 lb 4 oz (100.8 kg)  04/30/23 224 lb (101.6 kg)  05/08/22 224 lb 6 oz (101.8 kg)      Physical Exam Constitutional:      General: He is not in acute distress.    Appearance: Normal appearance. He is well-developed. He is not ill-appearing or  toxic-appearing.  HENT:     Head: Normocephalic and atraumatic.     Right Ear: Hearing, tympanic membrane, ear canal and external ear normal.     Left Ear: Hearing, tympanic membrane, ear canal and external ear normal.     Nose: Nose normal.     Mouth/Throat:     Pharynx: Uvula midline.  Eyes:     General: Lids are normal. Lids are everted, no foreign bodies appreciated.     Conjunctiva/sclera: Conjunctivae normal.     Pupils: Pupils are equal, round, and reactive to light.  Neck:     Thyroid: No thyroid mass or thyromegaly.     Vascular: No carotid bruit.     Trachea: Trachea and phonation normal.  Cardiovascular:     Rate and Rhythm: Normal rate and regular rhythm.     Pulses: Normal pulses.     Heart sounds: S1 normal and S2 normal. No murmur heard.    No gallop.  Pulmonary:     Breath sounds: Normal breath sounds. No wheezing, rhonchi or rales.  Abdominal:     General: Bowel sounds are normal.     Palpations: Abdomen is soft.     Tenderness: There is no abdominal tenderness. There is no guarding or rebound.     Hernia: No hernia is present.  Musculoskeletal:     Cervical back: Normal range of motion and neck supple.  Lymphadenopathy:     Cervical: No cervical adenopathy.  Skin:    General: Skin is warm and dry.     Findings: No rash.  Neurological:     Mental Status: He is alert.     Cranial Nerves: No cranial nerve deficit.     Sensory: No sensory deficit.     Gait: Gait normal.     Deep Tendon Reflexes: Reflexes are normal and symmetric.  Psychiatric:        Speech: Speech normal.        Behavior: Behavior normal.        Judgment: Judgment normal.       Results for orders placed or performed in visit on 05/05/23  Hemoglobin A1c   Collection Time: 05/05/23  9:22 AM  Result Value Ref Range   Hgb A1c MFr Bld 5.3 4.6 - 6.5 %  Lipid panel   Collection Time: 05/05/23  9:22 AM  Result Value Ref Range   Cholesterol 116 0 - 200 mg/dL   Triglycerides 09.8 0.0 -  149.0 mg/dL   HDL 11.91 (L) >47.82 mg/dL   VLDL 95.6 0.0 - 21.3 mg/dL   LDL Cholesterol 61 0 - 99 mg/dL   Total CHOL/HDL Ratio 3    NonHDL 80.26  Comprehensive metabolic panel   Collection Time: 05/05/23  9:22 AM  Result Value Ref Range   Sodium 141 135 - 145 mEq/L   Potassium 4.0 3.5 - 5.1 mEq/L   Chloride 107 96 - 112 mEq/L   CO2 27 19 - 32 mEq/L   Glucose, Bld 98 70 - 99 mg/dL   BUN 12 6 - 23 mg/dL   Creatinine, Ser 4.09 0.40 - 1.50 mg/dL   Total Bilirubin 1.3 (H) 0.2 - 1.2 mg/dL   Alkaline Phosphatase 55 39 - 117 U/L   AST 17 0 - 37 U/L   ALT 16 0 - 53 U/L   Total Protein 7.2 6.0 - 8.3 g/dL   Albumin 4.5 3.5 - 5.2 g/dL   GFR 81.19 (L) >14.78 mL/min   Calcium 9.4 8.4 - 10.5 mg/dL    This visit occurred during the SARS-CoV-2 public health emergency.  Safety protocols were in place, including screening questions prior to the visit, additional usage of staff PPE, and extensive cleaning of exam room while observing appropriate contact time as indicated for disinfecting solutions.   COVID 19 screen:  No recent travel or known exposure to COVID19 The patient denies respiratory symptoms of COVID 19 at this time. The importance of social distancing was discussed today.   Assessment and Plan The patient's preventative maintenance and recommended screening tests for an annual wellness exam were reviewed in full today. Brought up to date unless services declined.  Counselled on the importance of diet, exercise, and its role in overall health and mortality. The patient's FH and SH was reviewed, including their home life, tobacco status, and drug and alcohol status.   Vaccines: uptodate tdap, PNA, flu,COVID, Shingrix x 2 Prostate:  Father possibly late in life with PSA.Marland Kitchen No indication for  PSA/ prostate exam given age. Hep C: Neg. Colon: 2009 polyps, repeat in 10 years. Dr. Bosie Clos.. Neg IFOB  05/2018.Marland Kitchen no further eval needed. Former smoker:  Remotely, asymptomatic.   Problem List  Items Addressed This Visit     Chronic kidney disease, stage 2, mildly decreased GFR    Chronic, first noted in 2022, very stable, improved woith water intake.  Avoiding nephrotoxic meds.  No DM.      Prediabetes   Acute, borderline.  Discussed a low-carb diet, regular exercise and weight management.      Pure hypercholesterolemia   Stable, chronic.  Continue current medication.   Atorvastatin  10 mg 1/2 tablet daily      Routine general medical examination at a health care facility - Primary    Kerby Nora, MD

## 2023-05-12 NOTE — Assessment & Plan Note (Signed)
Acute, borderline.  Discussed a low-carb diet, regular exercise and weight management.

## 2023-05-12 NOTE — Assessment & Plan Note (Signed)
 Chronic, first noted in 2022, very stable, improved woith water intake.  Avoiding nephrotoxic meds.  No DM.

## 2023-07-21 ENCOUNTER — Telehealth: Payer: Self-pay | Admitting: Family Medicine

## 2023-07-21 NOTE — Telephone Encounter (Unsigned)
 Copied from CRM [id number]. Topic: Clinical - Medication Refill >> Jul 21, 2023  1:36 PM Alyse July wrote: Medication: atorvastatin  (LIPITOR) 10 MG tablet   Has the patient contacted their pharmacy? Yes (Agent: If no, request that the patient contact the pharmacy for the refill. If patient does not wish to contact the pharmacy document the reason why and proceed with request.) (Agent: If yes, when and what did the pharmacy advise?)  This is the patient's preferred pharmacy:  [id number] - [place] - [address] [place] ([place]) [place] [address] Phone: [phone] Fax: [phone]  Is this the correct pharmacy for this prescription? Yes If no, delete pharmacy and type the correct one.   Has the prescription been filled recently? No  Is the patient out of the medication? No  Has the patient been seen for an appointment in the last year OR does the patient have an upcoming appointment? Yes  Can we respond through MyChart? Yes  Agent: Please be advised that Rx refills may take up to 3 business days. We ask that you follow-up with your pharmacy.

## 2023-07-22 MED ORDER — ATORVASTATIN CALCIUM 10 MG PO TABS
15.0000 mg | ORAL_TABLET | Freq: Every day | ORAL | 3 refills | Status: DC
Start: 2023-07-22 — End: 2023-10-14

## 2023-07-22 NOTE — Telephone Encounter (Signed)
 Refills sent as requested

## 2023-07-24 ENCOUNTER — Other Ambulatory Visit: Payer: Self-pay | Admitting: Family Medicine

## 2023-10-14 ENCOUNTER — Other Ambulatory Visit: Payer: Self-pay | Admitting: Family Medicine

## 2023-10-14 MED ORDER — ATORVASTATIN CALCIUM 10 MG PO TABS: 15.0000 mg | ORAL_TABLET | Freq: Every day | ORAL | 5 refills | Status: AC

## 2023-10-14 NOTE — Telephone Encounter (Signed)
 Copied from CRM [id number]. Topic: Clinical - Medication Refill >> Oct 14, 2023  1:32 PM Thersia C wrote: Medication: atorvastatin  (LIPITOR) 10 MG tablet   Has the patient contacted their pharmacy? Yes (Agent: If no, request that the patient contact the pharmacy for the refill. If patient does not wish to contact the pharmacy document the reason why and proceed with request.) (Agent: If yes, when and what did the pharmacy advise?)  This is the patient's preferred pharmacy:    [id number] - [place]), [place] - [address] [place] ([place]) [place] [address] Phone: [phone] Fax: [phone]  Is this the correct pharmacy for this prescription? Yes If no, delete pharmacy and type the correct one.   Has the prescription been filled recently? No  Is the patient out of the medication? Yes  Has the patient been seen for an appointment in the last year OR does the patient have an upcoming appointment? Yes  Can we respond through MyChart? Yes  Agent: Please be advised that Rx refills may take up to 3 business days. We ask that you follow-up with your pharmacy.

## 2023-11-23 ENCOUNTER — Telehealth: Payer: Self-pay

## 2023-11-23 NOTE — Telephone Encounter (Signed)
 Spoke with patient's wife and let her know that they do not need an order for the flu shot.   I offered to schedule them a nurse visit but she wants to check with [facility] first.

## 2023-11-23 NOTE — Telephone Encounter (Signed)
 Copied from CRM #[id number]. Topic: Clinical - Request for Lab/Test Order >> Nov 23, 2023 10:42 AM Charolett L wrote: Reason for CRM: patient is requesting an order for the flu shot and would like to be called once placed

## 2023-12-29 ENCOUNTER — Other Ambulatory Visit: Payer: Self-pay | Admitting: Family Medicine

## 2023-12-29 MED ORDER — SILDENAFIL CITRATE 50 MG PO TABS: 50.0000 mg | ORAL_TABLET | Freq: Every day | ORAL | 2 refills | Status: AC | PRN

## 2023-12-29 NOTE — Telephone Encounter (Signed)
 Copied from CRM [id number]. Topic: Clinical - Medication Refill >> Dec 29, 2023 10:50 AM Burnard DEL wrote: Medication: sildenafil  (VIAGRA ) 50 MG tablet  Has the patient contacted their pharmacy? No (Agent: If no, request that the patient contact the pharmacy for the refill. If patient does not wish to contact the pharmacy document the reason why and proceed with request.) (Agent: If yes, when and what did the pharmacy advise?)  This is the patient's preferred pharmacy:  [facility] [place], [place] - [id number] [place] [address] Phone: [phone] Fax: [phone]   Is this the correct pharmacy for this prescription? Yes If no, delete pharmacy and type the correct one.   Has the prescription been filled recently? No  Is the patient out of the medication? Yes  Has the patient been seen for an appointment in the last year OR does the patient have an upcoming appointment? Yes  Can we respond through MyChart? No  Agent: Please be advised that Rx refills may take up to 3 business days. We ask that you follow-up with your pharmacy.

## 2024-05-02 ENCOUNTER — Ambulatory Visit
